# Patient Record
Sex: Male | Born: 1970 | Race: White | Hispanic: No | Marital: Single | Smoking: Former smoker
Health system: Southern US, Community
[De-identification: ages and names within clinical notes are randomized; demographics above are authoritative.]

## PROBLEM LIST (undated history)

## (undated) ENCOUNTER — Ambulatory Visit: Discharge status: Absent without leave | Payer: BLUE CROSS/BLUE SHIELD

## (undated) DIAGNOSIS — J020 Streptococcal pharyngitis: Secondary | ICD-10-CM

## (undated) HISTORY — PX: BACK SURGERY: SHX140

## (undated) HISTORY — PX: TONSILLECTOMY: SUR1361

---

## 2000-12-29 ENCOUNTER — Encounter: Payer: Self-pay | Admitting: Emergency Medicine

## 2000-12-29 ENCOUNTER — Emergency Department (HOSPITAL_COMMUNITY): Admission: EM | Admit: 2000-12-29 | Discharge: 2000-12-30 | Payer: Self-pay | Admitting: Emergency Medicine

## 2000-12-30 ENCOUNTER — Encounter: Payer: Self-pay | Admitting: Emergency Medicine

## 2001-01-05 ENCOUNTER — Emergency Department (HOSPITAL_COMMUNITY): Admission: EM | Admit: 2001-01-05 | Discharge: 2001-01-05 | Payer: Self-pay

## 2006-08-19 ENCOUNTER — Emergency Department: Payer: Self-pay | Admitting: Emergency Medicine

## 2007-12-04 ENCOUNTER — Ambulatory Visit: Payer: Self-pay | Admitting: Anesthesiology

## 2007-12-15 ENCOUNTER — Ambulatory Visit: Payer: Self-pay | Admitting: Anesthesiology

## 2008-01-13 ENCOUNTER — Ambulatory Visit: Payer: Self-pay | Admitting: Anesthesiology

## 2008-03-04 ENCOUNTER — Ambulatory Visit: Payer: Self-pay | Admitting: Anesthesiology

## 2008-04-13 ENCOUNTER — Ambulatory Visit: Payer: Self-pay | Admitting: Anesthesiology

## 2008-05-18 ENCOUNTER — Ambulatory Visit (HOSPITAL_COMMUNITY): Admission: RE | Admit: 2008-05-18 | Discharge: 2008-05-19 | Payer: Self-pay | Admitting: Neurosurgery

## 2008-06-22 ENCOUNTER — Encounter: Admission: RE | Admit: 2008-06-22 | Discharge: 2008-06-22 | Payer: Self-pay | Admitting: Neurosurgery

## 2008-06-22 ENCOUNTER — Emergency Department (HOSPITAL_COMMUNITY): Admission: EM | Admit: 2008-06-22 | Discharge: 2008-06-23 | Payer: Self-pay | Admitting: Emergency Medicine

## 2008-07-07 ENCOUNTER — Encounter: Admission: RE | Admit: 2008-07-07 | Discharge: 2008-07-07 | Payer: Self-pay | Admitting: Neurosurgery

## 2008-07-26 ENCOUNTER — Encounter: Admission: RE | Admit: 2008-07-26 | Discharge: 2008-07-26 | Payer: Self-pay | Admitting: Neurosurgery

## 2009-02-15 ENCOUNTER — Encounter: Admission: RE | Admit: 2009-02-15 | Discharge: 2009-02-15 | Payer: Self-pay | Admitting: Neurosurgery

## 2010-07-12 LAB — DIFFERENTIAL
Basophils Absolute: 0 10*3/uL (ref 0.0–0.1)
Basophils Relative: 0 % (ref 0–1)
Eosinophils Absolute: 0 10*3/uL (ref 0.0–0.7)
Eosinophils Relative: 0 % (ref 0–5)
Lymphocytes Relative: 14 % (ref 12–46)
Lymphs Abs: 0.6 10*3/uL — ABNORMAL LOW (ref 0.7–4.0)
Monocytes Absolute: 0.1 10*3/uL (ref 0.1–1.0)
Monocytes Relative: 1 % — ABNORMAL LOW (ref 3–12)
Neutro Abs: 4 10*3/uL (ref 1.7–7.7)
Neutrophils Relative %: 85 % — ABNORMAL HIGH (ref 43–77)

## 2010-07-12 LAB — CBC
HCT: 41.9 % (ref 39.0–52.0)
Hemoglobin: 14.8 g/dL (ref 13.0–17.0)
MCHC: 35.4 g/dL (ref 30.0–36.0)
MCV: 83.9 fL (ref 78.0–100.0)
Platelets: 312 10*3/uL (ref 150–400)
RBC: 4.99 MIL/uL (ref 4.22–5.81)
RDW: 13.2 % (ref 11.5–15.5)
WBC: 4.7 10*3/uL (ref 4.0–10.5)

## 2010-07-12 LAB — BASIC METABOLIC PANEL
BUN: 11 mg/dL (ref 6–23)
CO2: 21 mEq/L (ref 19–32)
Calcium: 9.8 mg/dL (ref 8.4–10.5)
Chloride: 108 mEq/L (ref 96–112)
Creatinine, Ser: 1.18 mg/dL (ref 0.4–1.5)
GFR calc Af Amer: >60 mL/min (ref >60)
GFR calc non Af Amer: >60 mL/min (ref >60)
Glucose, Bld: 381 mg/dL — ABNORMAL HIGH (ref 70–99)
Potassium: 4 mEq/L (ref 3.5–5.1)
Sodium: 138 mEq/L (ref 135–145)

## 2010-07-12 LAB — TSH: TSH: 0.269 u[IU]/mL — ABNORMAL LOW (ref 0.350–4.500)

## 2010-07-17 LAB — CBC
HCT: 42.8 % (ref 39.0–52.0)
Hemoglobin: 14.9 g/dL (ref 13.0–17.0)
MCHC: 34.9 g/dL (ref 30.0–36.0)
MCV: 85 fL (ref 78.0–100.0)
Platelets: 227 10*3/uL (ref 150–400)
RBC: 5.03 MIL/uL (ref 4.22–5.81)
RDW: 13.3 % (ref 11.5–15.5)
WBC: 3.6 10*3/uL — ABNORMAL LOW (ref 4.0–10.5)

## 2010-08-14 NOTE — Op Note (Signed)
NAME:  Travis Dorsey, Travis Dorsey              ACCOUNT NO.:  [id number]   MEDICAL RECORD NO.:  [id number]          PATIENT TYPE:  OIB   LOCATION:  3536                         FACILITY:  [facility]   PHYSICIAN:  Hilda Lias, M.D.   DATE OF BIRTH:  09/23/1970   DATE OF PROCEDURE:  05/18/2008  DATE OF DISCHARGE:                               OPERATIVE REPORT   PREOPERATIVE DIAGNOSIS:  Right L5-S1 herniated disk.   POSTOPERATIVE DIAGNOSES:  Right L5-S1 herniated disk.   PROCEDURES:  Right L5-S1 diskectomy, removal of fragment, foraminotomy;  microscope.   SURGEON:  Hilda Lias, MD   ASSISTANT:  Cristi Loron, MD   CLINICAL HISTORY:  The patient was seen because of back pain worse than  right leg.  He had failed with conservative treatment.  X-rays showed a  large herniated disk at level 5-1.  Surgery was advised.   PROCEDURE:  The patient was taken to the OR, and after intubation, he  was positioned in a prone manner.  The skin was cleaned with DuraPrep.  X-rays showed that we were at the level 5-1.  Then after we draped the  area, midline incision was done for L5-S1 and muscle retracted  laterally.  We brought microscope into the area and with a drill, we  drilled the lower lamina of L5 at the level of S1.  A thick yellow  ligament was also excised.  Immediately what was found was a large  herniated disk and the S1 nerve root was attached to it.  Lysis was  accomplished, and we retracted the S1 nerve root.  Then, incision was  made and a large fragment was removed.  The ligament was opened, and we  went to the disk space where midline and lateral to his right,  diskectomy was accomplished.  At the end, we had a good decompression of  the S1 as well as the thecal sac.  Investigation with the nerve hook  showed no more evidence of fragment.  The patient had quite a bit of  spondylosis.  Then the area was irrigated.  Valsalva maneuver was  negative.  Fentanyl and Depo-Medrol were left in  the epidural space, and  the wound was closed with Vicryl and Steri-Strips.           ______________________________  Hilda Lias, M.D.     EB/MEDQ  D:  05/18/2008  T:  05/19/2008  Job:  [id number]

## 2010-08-14 NOTE — H&P (Signed)
NAME:  ARTEZ, Travis Dorsey              ACCOUNT NO.:  [id number]   MEDICAL RECORD NO.:  [id number]          PATIENT TYPE:  INP   LOCATION:  1826                         FACILITY:  [facility]   PHYSICIAN:  Carlena Hurl, MDDATE OF BIRTH:  Apr 09, 1970   DATE OF ADMISSION:  06/22/2008  DATE OF DISCHARGE:  06/23/2008                              HISTORY & PHYSICAL   CHIEF COMPLAINT:  Nonspecific symptoms of blurred vision, dizziness, and  one episode of syncope of one day duration.   HISTORY OF PRESENT ILLNESS:  This is a 40 year old Caucasian gentleman  with past medical history significant for chronic back pain with status  post L5-S1 diskectomy done in February 2010, coming in with a 1-day  episode of blurred vision, dizziness, and syncopal episode.  The history  is obtained from the patient.  According to him, this patient had hurt  his back in July 2009 and during which time, he had a herniated disk of  L5-S1 and he underwent L5-S1 diskectomy with removal of fragment and  foraminotomy from Dr. Jeral Fruit on May 18, 2008.  Since then he was  started on pain medications as well as Valium and Neurontin.  He says  that whenever he takes his Valium or Neurontin, he gets vague  nonspecific symptoms of blurred vision, dizziness, and also he says that  he cannot think straight and because of that he went to see his  physician and was told to stop taking these medication immediately.  After stopping Neurontin and Valium, the patient's symptoms disappeared.  But again in the past 2 days, the patient started having significant  pain of his back as well as the right leg and around which time, the  patient had an MRI done and which showed significant scar and his  physician started him back on his Valium and Neurontin.  For this, he  again started having this blurred vision, dizziness, and also while at  home, when the patient tried to move from couch to his bedroom, he had 1  episode of  syncope.  He states that he does not remember exactly what  happened after few minutes.  He saw his friends surrounding him and also  his wife and they told him that he was having some trouble breathing and  had loss of consciousness.  Again, this morning, the patient underwent  an epidural injection for his back pain and the patient and the  patient's family does not remember exactly what dose was given to  epidural.  He states that even after epidural, the patient continued to  have the same problems of blurred vision and dizziness and also he had  significant numbness of bilateral arms and bilateral hands.  The patient  denies having any headache, chest pain.  The patient denies having any  abdominal pain, diarrhea, or constipation.   PAST MEDICAL HISTORY:  Significant for chronic back pain and nothing  significant.   PAST SURGICAL HISTORY:  Significant for L5-S1 diskectomy on May 18, 2008.   ALLERGIES:  No known drug allergies.   MEDICATIONS:  This patient is on at home  are Valium, Neurontin.   FAMILY HISTORY:  Nothing significant.   REVIEW OF SYSTEMS:  Pretty much the same as history of present illness.   PHYSICAL EXAMINATION:  GENERAL:  This is a 40 year old gentleman who is  lying comfortably on the bed without any significant chest pain or  shortness of breath.  VITAL SIGNS:  His blood pressure when he came into the ER 146/94, later  dropped down to 112/75, pulse rate of 130, which later dropped to 101,  respiratory rate of 22, saturating 97% on room air.  HEENT:  Head is atraumatic and normocephalic.  Pupils, PERRLA.  Tympanic  membrane intact.  No discharge from the eyes or ears.  LUNGS:  Clear to auscultation bilaterally.  CVS:  S1, S2 heard with regular rate and rhythm.  ABDOMEN:  Soft.  Bowel sounds present.  Nontender.  Nondistended.  EXTREMITIES:  No pedal edema noted.  Pulses are palpable bilaterally.  CNS:  Awake, alert, oriented x3.  No focal neurological  deficits noted.   LABORATORY DATA:  WBC 4.7, hemoglobin 14.8, platelets of 312.  Sodium  138, potassium 4.0, chloride 108, bicarb 28, glucose 381, BUN of 11,  creatinine of 1.18.  The patient had CT chest done, which showed  multiple, small, shaggy pulmonary nodules in the posterior aspect of the  right upper lobe consistent with an atypical infection.   ASSESSMENT AND PLAN:  1. Sinus tachycardia.  At this time, clearly no source of infection is      found other than the CT chest, which showed right upper lobe small      pulmonary nodules consistent with atypical infection.  The patient      denies having any temperature at home and he was having only cough,      but other than that he was not bringing up any sputum and no      significant chills or fever at home and nobody is sick at home.      The patient had walking pneumonia that was about 8 years ago, when      he was in [employer] but since then he never had any problems with      his lung.  So at this time, we are going to start this patient on      Zithromax 500 mg p.o. 1 time a day to cover this atypical infection      and hopefully once this infection resolves, the sinus tachycardia      should resolve.  So, we will continue to monitor his heart rate,      and if the patient's heart rate continues to be high even after his      infection is cured, we will evaluate his sinus tachycardia further.  2. Nonspecific symptoms/polypharmacy.  The symptoms that the patient      is having so far is because of his Valium and Neurontin.  So at      this time, we are going to hold all the medications and give him      only Tylenol as needed for pain as well as Dilaudid as needed for      severe pain.  3. History of chronic back pain with status post surgery.  The patient      states that his pain is much tolerable, so we will give him only      Tylenol at this time.  For DVT prophylaxis, the patient will be  placed on Lovenox 40 mg subcu  one time a day.      Carlena Hurl, MD  Electronically Signed     JD/MEDQ  D:  06/22/2008  T:  06/23/2008  Job:  [id number]

## 2015-02-09 ENCOUNTER — Ambulatory Visit
Admission: EM | Admit: 2015-02-09 | Discharge: 2015-02-09 | Disposition: A | Discharge status: Home / Self Care | Payer: BLUE CROSS/BLUE SHIELD | Attending: Family Medicine | Admitting: Family Medicine

## 2015-02-09 DIAGNOSIS — J069 Acute upper respiratory infection, unspecified: Secondary | ICD-10-CM | POA: Diagnosis not present

## 2015-02-09 DIAGNOSIS — R6889 Other general symptoms and signs: Secondary | ICD-10-CM

## 2015-02-09 DIAGNOSIS — J0100 Acute maxillary sinusitis, unspecified: Secondary | ICD-10-CM

## 2015-02-09 HISTORY — DX: Streptococcal pharyngitis: J02.0

## 2015-02-09 LAB — RAPID INFLUENZA A&B ANTIGENS
Influenza A (ARMC): NEGATIVE
Influenza B (ARMC): NEGATIVE

## 2015-02-09 LAB — RAPID STREP SCREEN (MED CTR MEBANE ONLY): Streptococcus, Group A Screen (Direct): NEGATIVE

## 2015-02-09 MED ORDER — HYDROCOD POLST-CPM POLST ER 10-8 MG/5ML PO SUER: 5.0000 mL | Freq: Two times a day (BID) | ORAL | Status: DC | PRN

## 2015-02-09 MED ORDER — FEXOFENADINE-PSEUDOEPHED ER 180-240 MG PO TB24: 1.0000 | ORAL_TABLET | Freq: Every day | ORAL | Status: DC

## 2015-02-09 MED ORDER — ALBUTEROL SULFATE HFA 108 (90 BASE) MCG/ACT IN AERS: 2.0000 | INHALATION_SPRAY | RESPIRATORY_TRACT | Status: DC | PRN

## 2015-02-09 MED ORDER — AZITHROMYCIN 500 MG PO TABS: 500.0000 mg | ORAL_TABLET | Freq: Every day | ORAL | Status: DC

## 2015-02-09 MED ORDER — OSELTAMIVIR PHOSPHATE 75 MG PO CAPS: 75.0000 mg | ORAL_CAPSULE | Freq: Two times a day (BID) | ORAL | Status: DC

## 2015-02-09 NOTE — ED Notes (Signed)
Saturday started with head congestion and sore throat. Felt a lot better Sunday, then Monday worsening head congestion and Tuesday productive cough. Denies fever

## 2015-02-09 NOTE — Discharge Instructions (Signed)
Sinusitis, Adult Sinusitis is redness, soreness, and puffiness (inflammation) of the air pockets in the bones of your face (sinuses). The redness, soreness, and puffiness can cause air and mucus to get trapped in your sinuses. This can allow germs to grow and cause an infection.  HOME CARE   Drink enough fluids to keep your pee (urine) clear or pale yellow.  Use a humidifier in your home.  Run a hot shower to create steam in the bathroom. Sit in the bathroom with the door closed. Breathe in the steam 3-4 times a day.  Put a warm, moist washcloth on your face 3-4 times a day, or as told by your doctor.  Use salt water sprays (saline sprays) to wet the thick fluid in your nose. This can help the sinuses drain.  Only take medicine as told by your doctor. GET HELP RIGHT AWAY IF:   Your pain gets worse.  You have very bad headaches.  You are sick to your stomach (nauseous).  You throw up (vomit).  You are very sleepy (drowsy) all the time.  Your face is puffy (swollen).  Your vision changes.  You have a stiff neck.  You have trouble breathing. MAKE SURE YOU:   Understand these instructions.  Will watch your condition.  Will get help right away if you are not doing well or get worse.   This information is not intended to replace advice given to you by your health care provider. Make sure you discuss any questions you have with your health care provider.   Document Released: 09/04/2007 Document Revised: 04/08/2014 Document Reviewed: 10/22/2011 Elsevier Interactive Patient Education 2016 Elsevier Inc.  Upper Respiratory Infection, Adult Most upper respiratory infections (URIs) are caused by a virus. A URI affects the nose, throat, and upper air passages. The most common type of URI is often called "the common cold." HOME CARE   Take medicines only as told by your doctor.  Gargle warm saltwater or take cough drops to comfort your throat as told by your doctor.  Use a  warm mist humidifier or inhale steam from a shower to increase air moisture. This may make it easier to breathe.  Drink enough fluid to keep your pee (urine) clear or pale yellow.  Eat soups and other clear broths.  Have a healthy diet.  Rest as needed.  Go back to work when your fever is gone or your doctor says it is okay.  You may need to stay home longer to avoid giving your URI to others.  You can also wear a face mask and wash your hands often to prevent spread of the virus.  Use your inhaler more if you have asthma.  Do not use any tobacco products, including cigarettes, chewing tobacco, or electronic cigarettes. If you need help quitting, ask your doctor. GET HELP IF:  You are getting worse, not better.  Your symptoms are not helped by medicine.  You have chills.  You are getting more short of breath.  You have brown or red mucus.  You have yellow or brown discharge from your nose.  You have pain in your face, especially when you bend forward.  You have a fever.  You have puffy (swollen) neck glands.  You have pain while swallowing.  You have white areas in the back of your throat. GET HELP RIGHT AWAY IF:   You have very bad or constant:  Headache.  Ear pain.  Pain in your forehead, behind your eyes, and over  your cheekbones (sinus pain).  Chest pain.  You have long-lasting (chronic) lung disease and any of the following:  Wheezing.  Long-lasting cough.  Coughing up blood.  A change in your usual mucus.  You have a stiff neck.  You have changes in your:  Vision.  Hearing.  Thinking.  Mood. MAKE SURE YOU:   Understand these instructions.  Will watch your condition.  Will get help right away if you are not doing well or get worse.   This information is not intended to replace advice given to you by your health care provider. Make sure you discuss any questions you have with your health care provider.   Document Released:  09/04/2007 Document Revised: 08/02/2014 Document Reviewed: 06/23/2013 Elsevier Interactive Patient Education 2016 Elsevier Inc.  Viral Infections A virus is a type of germ. Viruses can cause:  Minor sore throats.  Aches and pains.  Headaches.  Runny nose.  Rashes.  Watery eyes.  Tiredness.  Coughs.  Loss of appetite.  Feeling sick to your stomach (nausea).  Throwing up (vomiting).  Watery poop (diarrhea). HOME CARE   Only take medicines as told by your doctor.  Drink enough water and fluids to keep your pee (urine) clear or pale yellow. Sports drinks are a good choice.  Get plenty of rest and eat healthy. Soups and broths with crackers or rice are fine. GET HELP RIGHT AWAY IF:   You have a very bad headache.  You have shortness of breath.  You have chest pain or neck pain.  You have an unusual rash.  You cannot stop throwing up.  You have watery poop that does not stop.  You cannot keep fluids down.  You or your child has a temperature by mouth above 102 F (38.9 C), not controlled by medicine.  Your baby is older than 3 months with a rectal temperature of 102 F (38.9 C) or higher.  Your baby is 643 months old or younger with a rectal temperature of 100.4 F (38 C) or higher. MAKE SURE YOU:   Understand these instructions.  Will watch this condition.  Will get help right away if you are not doing well or get worse.   This information is not intended to replace advice given to you by your health care provider. Make sure you discuss any questions you have with your health care provider.   Document Released: 02/29/2008 Document Revised: 06/10/2011 Document Reviewed: 08/24/2014 Elsevier Interactive Patient Education 2016 Elsevier Inc.  Cough, Adult A cough helps to clear your throat and lungs. A cough may last only 2-3 weeks (acute), or it may last longer than 8 weeks (chronic). Many different things can cause a cough. A cough may be a sign of an  illness or another medical condition. HOME CARE  Pay attention to any changes in your cough.  Take medicines only as told by your doctor.  If you were prescribed an antibiotic medicine, take it as told by your doctor. Do not stop taking it even if you start to feel better.  Talk with your doctor before you try using a cough medicine.  Drink enough fluid to keep your pee (urine) clear or pale yellow.  If the air is dry, use a cold steam vaporizer or humidifier in your home.  Stay away from things that make you cough at work or at home.  If your cough is worse at night, try using extra pillows to raise your head up higher while you sleep.  Do not smoke, and try not to be around smoke. If you need help quitting, ask your doctor.  Do not have caffeine.  Do not drink alcohol.  Rest as needed. GET HELP IF:  You have new problems (symptoms).  You cough up yellow fluid (pus).  Your cough does not get better after 2-3 weeks, or your cough gets worse.  Medicine does not help your cough and you are not sleeping well.  You have pain that gets worse or pain that is not helped with medicine.  You have a fever.  You are losing weight and you do not know why.  You have night sweats. GET HELP RIGHT AWAY IF:  You cough up blood.  You have trouble breathing.  Your heartbeat is very fast.   This information is not intended to replace advice given to you by your health care provider. Make sure you discuss any questions you have with your health care provider.   Document Released: 11/29/2010 Document Revised: 12/07/2014 Document Reviewed: 05/25/2014 Elsevier Interactive Patient Education [employer]

## 2015-02-09 NOTE — ED Provider Notes (Signed)
CSN: [id number]     Arrival date & time 02/09/15  [id number] History   First MD Initiated Contact with Patient 02/09/15 [phone]      Nurses notes were reviewed. Chief Complaint  Patient presents with  . URI    Patient states he started developing his usual yearly cold Saturday got better on Sunday but then on Monday start becoming congested again and cold-like symptoms. By Tuesday he started having myalgia chills and aching all over and get worse yesterday and is continued today. States that last night especially difficult from sleep and rest had trouble with his esophageal reflux and blood coming from the right nostril to his nose pain in both ears and a sore throat as well.  (Consider location/radiation/quality/duration/timing/severity/associated sxs/prior Treatment) Patient is a 44 y.o. male presenting with URI. The history is provided by the patient and the spouse. No language interpreter was used.  URI Presenting symptoms: congestion, ear pain, facial pain, fatigue, rhinorrhea and sore throat   Congestion:    Location:  Nasal and chest Ear pain:    Location:  Bilateral   Severity:  Moderate   Chronicity:  New Fatigue:    Severity:  Moderate   Duration:  2 days   Timing:  Constant   Progression:  Worsening Severity:  Mild Associated symptoms: headaches, myalgias, sinus pain, swollen glands and wheezing   Risk factors: not elderly, no chronic cardiac disease, no chronic kidney disease, no chronic respiratory disease, no diabetes mellitus, no immunosuppression, no recent illness, no recent travel and no sick contacts     Past Medical History  Diagnosis Date  . Strep throat    Past Surgical History  Procedure Laterality Date  . Back surgery    . Tonsillectomy     History reviewed. No pertinent family history. Social History  Substance Use Topics  . Smoking status: Former Games developermoker  . Smokeless tobacco: None  . Alcohol Use: No    Review of Systems  Constitutional: Positive for  fatigue.  HENT: Positive for congestion, ear pain, rhinorrhea and sore throat.   Respiratory: Positive for wheezing.   Musculoskeletal: Positive for myalgias.  Neurological: Positive for headaches.  All other systems reviewed and are negative.   Allergies  Penicillins  Home Medications   Prior to Admission medications   Medication Sig Start Date End Date Taking? Authorizing Provider  albuterol (PROVENTIL HFA;VENTOLIN HFA) 108 (90 BASE) MCG/ACT inhaler Inhale 2 puffs into the lungs every 4 (four) hours as needed for wheezing or shortness of breath. 02/09/15   Hassan RowanEugene Monigue Spraggins, MD  azithromycin (ZITHROMAX) 500 MG tablet Take 1 tablet (500 mg total) by mouth daily. Take first 2 tablets together, then 1 every day until finished. 02/09/15   Hassan RowanEugene Sloan Galentine, MD  chlorpheniramine-HYDROcodone [facility](TUSSIONEX PENNKINETIC ER) 10-8 MG/5ML SUER Take 5 mLs by mouth every 12 (twelve) hours as needed for cough. 02/09/15   Hassan RowanEugene Kalkidan Caudell, MD  fexofenadine-pseudoephedrine (ALLEGRA-D ALLERGY & CONGESTION) 180-240 MG 24 hr tablet Take 1 tablet by mouth daily. 02/09/15   Hassan RowanEugene Laityn Bensen, MD  oseltamivir (TAMIFLU) 75 MG capsule Take 1 capsule (75 mg total) by mouth 2 (two) times daily. 02/09/15   Hassan RowanEugene Trammell Bowden, MD   Meds Ordered and Administered this Visit  Medications - No data to display  BP 134/96 mmHg  Pulse 102  Temp(Src) 97 F (36.1 C) (Tympanic)  Resp 18  Ht 6\' 1"  (1.854 m)  Wt 234 lb (106.142 kg)  BMI 30.88 kg/m2  SpO2 98% Orthostatic VS for the past  24 hrs:  BP- Lying Pulse- Lying BP- Sitting Pulse- Sitting BP- Standing at 0 minutes Pulse- Standing at 0 minutes  02/09/15 0804 132/86 mmHg 96 (!) 137/108 mmHg 96 (!) 128/100 mmHg 94    Physical Exam  Constitutional: He appears well-developed and well-nourished.  HENT:  Head: Normocephalic and atraumatic.  Right Ear: Hearing normal. Tympanic membrane is scarred.  Left Ear: Hearing normal. Tympanic membrane is scarred.  Nose: Mucosal edema and rhinorrhea  present.  Mouth/Throat: He does not have dentures. Normal dentition. No dental caries. Posterior oropharyngeal erythema present.  Eyes: Conjunctivae are normal. Pupils are equal, round, and reactive to light.  Neck: Normal range of motion. Neck supple. No thyromegaly present.  Cardiovascular: Normal rate.   Pulmonary/Chest: Effort normal and breath sounds normal. No respiratory distress.  Musculoskeletal: Normal range of motion.  Neurological: He is alert.  Skin: Skin is warm and dry. No erythema.  Psychiatric: He has a normal mood and affect. His behavior is normal.  Vitals reviewed.   ED Course  Procedures (including critical care time)  Labs Review Labs Reviewed  RAPID INFLUENZA A&B ANTIGENS ([facility] ONLY)  RAPID STREP SCREEN (NOT AT [facility])  CULTURE, GROUP A STREP ([facility] ONLY)    Imaging Review No results found.   Visual Acuity Review  Right Eye Distance:   Left Eye Distance:   Bilateral Distance:    Right Eye Near:   Left Eye Near:    Bilateral Near:       Results for orders placed or performed during the hospital encounter of 02/09/15  Rapid Influenza A&B Antigens ([facility] only)  Result Value Ref Range   Influenza A ([facility]) NEGATIVE    Influenza B ([facility]) NEGATIVE   Rapid strep screen  Result Value Ref Range   Streptococcus, Group A Screen (Direct) NEGATIVE NEGATIVE      MDM   1. URI, acute   2. Acute maxillary sinusitis, recurrence not specified   3. Flu-like symptoms     Patient will have strep test and flu test done. If flu test is not positive question whether placement medication keeping with the symptoms started Monday or Tuesday.    Hassan Rowan, MD 02/09/15 [phone]

## 2015-02-11 LAB — CULTURE, GROUP A STREP (THRC)

## 2015-02-13 NOTE — ED Notes (Signed)
Final report of strep testing negative  

## 2015-02-16 ENCOUNTER — Encounter: Payer: Self-pay | Admitting: Family Medicine

## 2015-02-16 ENCOUNTER — Ambulatory Visit (INDEPENDENT_AMBULATORY_CARE_PROVIDER_SITE_OTHER): Payer: BLUE CROSS/BLUE SHIELD | Admitting: Family Medicine

## 2015-02-16 VITALS — BP 134/93 | HR 90 | Temp 97.3°F | Ht 72.0 in | Wt 232.0 lb

## 2015-02-16 DIAGNOSIS — K219 Gastro-esophageal reflux disease without esophagitis: Secondary | ICD-10-CM | POA: Diagnosis not present

## 2015-02-16 DIAGNOSIS — R030 Elevated blood-pressure reading, without diagnosis of hypertension: Secondary | ICD-10-CM | POA: Diagnosis not present

## 2015-02-16 DIAGNOSIS — IMO0001 Reserved for inherently not codable concepts without codable children: Secondary | ICD-10-CM

## 2015-02-16 DIAGNOSIS — R195 Other fecal abnormalities: Secondary | ICD-10-CM | POA: Diagnosis not present

## 2015-02-16 DIAGNOSIS — J330 Polyp of nasal cavity: Secondary | ICD-10-CM

## 2015-02-16 DIAGNOSIS — R109 Unspecified abdominal pain: Secondary | ICD-10-CM

## 2015-02-16 MED ORDER — OMEPRAZOLE 20 MG PO CPDR: 20.0000 mg | DELAYED_RELEASE_CAPSULE | Freq: Two times a day (BID) | ORAL | Status: DC

## 2015-02-16 MED ORDER — MOMETASONE FUROATE 50 MCG/ACT NA SUSP: 2.0000 | Freq: Every day | NASAL | Status: DC
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
## 2015-02-16 NOTE — Patient Instructions (Addendum)
We'll refer you to a gastroenterologist Start the new medicine to help reduce acid Elevated the head of your bed slightly I do strongly encourage you to quit all smokless tobacco Try to follow the DASH guidelines to help control your blood pressure Stop the fexofenadrine with the decongestants Try to use PLAIN allergy medicine without the decongestant Avoid: phenylephrine, phenylpropanolamine, and pseudoephredine If you need something for aches or pains, try to use Tylenol (acetaminphen) instead of non-steroidals (which include Aleve, ibuprofen, Advil, Motrin, and naproxen); non-steroidals can cause long-term kidney damage Avoid lawn-mowing Start the new nasal spray Limit physical activity until next appointment  Gastroesophageal Reflux Disease, Adult Normally, food travels down the esophagus and stays in the stomach to be digested. However, when a person has gastroesophageal reflux disease (GERD), food and stomach acid move back up into the esophagus. When this happens, the esophagus becomes sore and inflamed. Over time, GERD can create small holes (ulcers) in the lining of the esophagus.  CAUSES This condition is caused by a problem with the muscle between the esophagus and the stomach (lower esophageal sphincter, or LES). Normally, the LES muscle closes after food passes through the esophagus to the stomach. When the LES is weakened or abnormal, it does not close properly, and that allows food and stomach acid to go back up into the esophagus. The LES can be weakened by certain dietary substances, medicines, and medical conditions, including:  Tobacco use.  Pregnancy.  Having a hiatal hernia.  Heavy alcohol use.  Certain foods and beverages, such as coffee, chocolate, onions, and peppermint. RISK FACTORS This condition is more likely to develop in:  People who have an increased body weight.  People who have connective tissue disorders.  People who use NSAID  medicines. SYMPTOMS Symptoms of this condition include:  Heartburn.  Difficult or painful swallowing.  The feeling of having a lump in the throat.  Abitter taste in the mouth.  Bad breath.  Having a large amount of saliva.  Having an upset or bloated stomach.  Belching.  Chest pain.  Shortness of breath or wheezing.  Ongoing (chronic) cough or a night-time cough.  Wearing away of tooth enamel.  Weight loss. Different conditions can cause chest pain. Make sure to see your health care provider if you experience chest pain. DIAGNOSIS Your health care provider will take a medical history and perform a physical exam. To determine if you have mild or severe GERD, your health care provider may also monitor how you respond to treatment. You may also have other tests, including:  An endoscopy toexamine your stomach and esophagus with a small camera.  A test thatmeasures the acidity level in your esophagus.  A test thatmeasures how much pressure is on your esophagus.  A barium swallow or modified barium swallow to show the shape, size, and functioning of your esophagus. TREATMENT The goal of treatment is to help relieve your symptoms and to prevent complications. Treatment for this condition may vary depending on how severe your symptoms are. Your health care provider may recommend:  Changes to your diet.  Medicine.  Surgery. HOME CARE INSTRUCTIONS Diet  Follow a diet as recommended by your health care provider. This may involve avoiding foods and drinks such as:  Coffee and tea (with or without caffeine).  Drinks that containalcohol.  Energy drinks and sports drinks.  Carbonated drinks or sodas.  Chocolate and cocoa.  Peppermint and mint flavorings.  Garlic and onions.  Horseradish.  Spicy and acidic foods, including  peppers, chili powder, curry powder, vinegar, hot sauces, and barbecue sauce.  Citrus fruit juices and citrus fruits, such as oranges,  lemons, and limes.  Tomato-based foods, such as red sauce, chili, salsa, and pizza with red sauce.  Fried and fatty foods, such as donuts, french fries, potato chips, and high-fat dressings.  High-fat meats, such as hot dogs and fatty cuts of red and white meats, such as rib eye steak, sausage, ham, and bacon.  High-fat dairy items, such as whole milk, butter, and cream cheese.  Eat small, frequent meals instead of large meals.  Avoid drinking large amounts of liquid with your meals.  Avoid eating meals during the 2-3 hours before bedtime.  Avoid lying down right after you eat.  Do not exercise right after you eat. General Instructions  Pay attention to any changes in your symptoms.  Take over-the-counter and prescription medicines only as told by your health care provider. Do not take aspirin, ibuprofen, or other NSAIDs unless your health care provider told you to do so.  Do not use any tobacco products, including cigarettes, chewing tobacco, and e-cigarettes. If you need help quitting, ask your health care provider.  Wear loose-fitting clothing. Do not wear anything tight around your waist that causes pressure on your abdomen.  Raise (elevate) the head of your bed 6 inches (15cm).  Try to reduce your stress, such as with yoga or meditation. If you need help reducing stress, ask your health care provider.  If you are overweight, reduce your weight to an amount that is healthy for you. Ask your health care provider for guidance about a safe weight loss goal.  Keep all follow-up visits as told by your health care provider. This is important. SEEK MEDICAL CARE IF:  You have new symptoms.  You have unexplained weight loss.  You have difficulty swallowing, or it hurts to swallow.  You have wheezing or a persistent cough.  Your symptoms do not improve with treatment.  You have a hoarse voice. SEEK IMMEDIATE MEDICAL CARE IF:  You have pain in your arms, neck, jaw,  teeth, or back.  You feel sweaty, dizzy, or light-headed.  You have chest pain or shortness of breath.  You vomit and your vomit looks like blood or coffee grounds.  You faint.  Your stool is bloody or black.  You cannot swallow, drink, or eat.   This information is not intended to replace advice given to you by your health care provider. Make sure you discuss any questions you have with your health care provider.   Document Released: 12/26/2004 Document Revised: 12/07/2014 Document Reviewed: 07/13/2014 Elsevier Interactive Patient Education [employer]2016 [employer] Inc[employer]

## 2015-02-17 LAB — CBC WITH DIFFERENTIAL/PLATELET
Basophils Absolute: 0 10*3/uL (ref 0.0–0.2)
Basos: 1 %
EOS (ABSOLUTE): 0.2 10*3/uL (ref 0.0–0.4)
Eos: 3 %
Hematocrit: 42.9 % (ref 37.5–51.0)
Hemoglobin: 14.6 g/dL (ref 12.6–17.7)
Immature Grans (Abs): 0 10*3/uL (ref 0.0–0.1)
Immature Granulocytes: 0 %
Lymphocytes Absolute: 1.8 10*3/uL (ref 0.7–3.1)
Lymphs: 32 %
MCH: 27.7 pg (ref 26.6–33.0)
MCHC: 34 g/dL (ref 31.5–35.7)
MCV: 81 fL (ref 79–97)
Monocytes Absolute: 0.6 10*3/uL (ref 0.1–0.9)
Monocytes: 11 %
Neutrophils Absolute: 3 10*3/uL (ref 1.4–7.0)
Neutrophils: 53 %
Platelets: 295 10*3/uL (ref 150–379)
RBC: 5.28 x10E6/uL (ref 4.14–5.80)
RDW: 13.8 % (ref 12.3–15.4)
WBC: 5.6 10*3/uL (ref 3.4–10.8)

## 2015-02-17 LAB — COMPREHENSIVE METABOLIC PANEL
ALT: 36 IU/L (ref 0–44)
AST: 26 IU/L (ref 0–40)
Albumin/Globulin Ratio: 1.9 (ref 1.1–2.5)
Albumin: 4.7 g/dL (ref 3.5–5.5)
Alkaline Phosphatase: 88 IU/L (ref 39–117)
BUN/Creatinine Ratio: 9 (ref 9–20)
BUN: 12 mg/dL (ref 6–24)
Bilirubin Total: 0.2 mg/dL (ref 0.0–1.2)
CO2: 24 mmol/L (ref 18–29)
Calcium: 9.8 mg/dL (ref 8.7–10.2)
Chloride: 99 mmol/L (ref 97–106)
Creatinine, Ser: 1.32 mg/dL — ABNORMAL HIGH (ref 0.76–1.27)
GFR calc Af Amer: 76 mL/min/{1.73_m2} (ref >59)
GFR calc non Af Amer: 66 mL/min/{1.73_m2} (ref >59)
Globulin, Total: 2.5 g/dL (ref 1.5–4.5)
Glucose: 124 mg/dL — ABNORMAL HIGH (ref 65–99)
Potassium: 4.4 mmol/L (ref 3.5–5.2)
Sodium: 141 mmol/L (ref 136–144)
Total Protein: 7.2 g/dL (ref 6.0–8.5)

## 2015-02-17 LAB — HELICOBACTER PYLORI  ANTIBODY, IGM: H pylori, IgM Abs: <9 units (ref 0.0–8.9)

## 2015-02-21 ENCOUNTER — Ambulatory Visit: Payer: BLUE CROSS/BLUE SHIELD | Admitting: Family Medicine

## 2015-02-21 DIAGNOSIS — R109 Unspecified abdominal pain: Secondary | ICD-10-CM | POA: Insufficient documentation

## 2015-02-21 DIAGNOSIS — J330 Polyp of nasal cavity: Secondary | ICD-10-CM | POA: Insufficient documentation

## 2015-02-21 DIAGNOSIS — IMO0001 Reserved for inherently not codable concepts without codable children: Secondary | ICD-10-CM | POA: Insufficient documentation

## 2015-02-21 DIAGNOSIS — K219 Gastro-esophageal reflux disease without esophagitis: Secondary | ICD-10-CM | POA: Insufficient documentation

## 2015-02-21 DIAGNOSIS — R030 Elevated blood-pressure reading, without diagnosis of hypertension: Secondary | ICD-10-CM

## 2015-02-21 NOTE — Assessment & Plan Note (Signed)
May be IBS; since symptoms have been debilitating and going on for about 37 years, referring to GI

## 2015-02-21 NOTE — Assessment & Plan Note (Addendum)
Check H pylor, CBC, CMP We'll refer you to a gastroenterologist Start the new medicine to help reduce acid (PPI) Elevated the head of your bed slightly I do strongly encourage you to quit all smokless tobacco Avoid triggers which can increase stomach acid production

## 2015-02-21 NOTE — Assessment & Plan Note (Signed)
Refer to ENT Avoid lawn-mowing Start the new nasal spray

## 2015-02-21 NOTE — Assessment & Plan Note (Signed)
Try to follow the DASH guidelines to help control your blood pressure Stop the fexofenadrine with the decongestants Try to use PLAIN allergy medicine without the decongestant Avoid: phenylephrine, phenylpropanolamine, and pseudoephredine If you need something for aches or pains, try to use Tylenol (acetaminphen) instead of non-steroidals (which include Aleve, ibuprofen, Advil, Motrin, and naproxen); non-steroidals can cause long-term kidney damage

## 2015-02-27 ENCOUNTER — Ambulatory Visit
Admission: RE | Admit: 2015-02-27 | Discharge: 2015-02-27 | Disposition: A | Discharge status: Home / Self Care | Payer: BLUE CROSS/BLUE SHIELD | Source: Ambulatory Visit | Attending: Family Medicine | Admitting: Family Medicine

## 2015-02-27 DIAGNOSIS — K449 Diaphragmatic hernia without obstruction or gangrene: Secondary | ICD-10-CM | POA: Insufficient documentation

## 2015-02-27 DIAGNOSIS — K219 Gastro-esophageal reflux disease without esophagitis: Secondary | ICD-10-CM | POA: Diagnosis not present

## 2015-03-01 ENCOUNTER — Ambulatory Visit (INDEPENDENT_AMBULATORY_CARE_PROVIDER_SITE_OTHER): Payer: BLUE CROSS/BLUE SHIELD | Admitting: Family Medicine

## 2015-03-01 ENCOUNTER — Encounter: Payer: Self-pay | Admitting: Family Medicine

## 2015-03-01 VITALS — BP 114/76 | HR 76 | Temp 97.2°F | Wt 236.0 lb

## 2015-03-01 DIAGNOSIS — E669 Obesity, unspecified: Secondary | ICD-10-CM

## 2015-03-01 DIAGNOSIS — R0602 Shortness of breath: Secondary | ICD-10-CM | POA: Diagnosis not present

## 2015-03-01 DIAGNOSIS — K449 Diaphragmatic hernia without obstruction or gangrene: Secondary | ICD-10-CM

## 2015-03-01 DIAGNOSIS — R109 Unspecified abdominal pain: Secondary | ICD-10-CM

## 2015-03-01 DIAGNOSIS — R942 Abnormal results of pulmonary function studies: Secondary | ICD-10-CM | POA: Diagnosis not present

## 2015-03-01 DIAGNOSIS — K219 Gastro-esophageal reflux disease without esophagitis: Secondary | ICD-10-CM | POA: Diagnosis not present

## 2015-03-01 DIAGNOSIS — R7989 Other specified abnormal findings of blood chemistry: Secondary | ICD-10-CM

## 2015-03-01 DIAGNOSIS — R748 Abnormal levels of other serum enzymes: Secondary | ICD-10-CM

## 2015-03-01 DIAGNOSIS — R030 Elevated blood-pressure reading, without diagnosis of hypertension: Secondary | ICD-10-CM

## 2015-03-01 DIAGNOSIS — J988 Other specified respiratory disorders: Secondary | ICD-10-CM

## 2015-03-01 DIAGNOSIS — IMO0001 Reserved for inherently not codable concepts without codable children: Secondary | ICD-10-CM

## 2015-03-01 DIAGNOSIS — R195 Other fecal abnormalities: Secondary | ICD-10-CM

## 2015-03-01 MED ORDER — FLUTICASONE PROPIONATE HFA 110 MCG/ACT IN AERO: 2.0000 | INHALATION_SPRAY | Freq: Two times a day (BID) | RESPIRATORY_TRACT | Status: DC

## 2015-03-01 NOTE — Patient Instructions (Addendum)
Use your current inhaler just when needed for wheezing or shortness of breath Use the new inhaler twice a day every day for inflammation Avoid triggers Return in 6 weeks for repeat spirometry If you need something for aches or pains, try to use Tylenol (acetaminphen) instead of non-steroidals (which include Aleve, ibuprofen, Advil, Motrin, and naproxen); non-steroidals can cause long-term kidney damage
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
## 2015-03-05 DIAGNOSIS — R942 Abnormal results of pulmonary function studies: Secondary | ICD-10-CM

## 2015-03-05 DIAGNOSIS — K449 Diaphragmatic hernia without obstruction or gangrene: Secondary | ICD-10-CM | POA: Insufficient documentation

## 2015-03-05 DIAGNOSIS — E669 Obesity, unspecified: Secondary | ICD-10-CM | POA: Insufficient documentation

## 2015-03-05 DIAGNOSIS — J988 Other specified respiratory disorders: Secondary | ICD-10-CM | POA: Insufficient documentation

## 2015-03-05 DIAGNOSIS — R0602 Shortness of breath: Secondary | ICD-10-CM | POA: Insufficient documentation

## 2015-03-05 DIAGNOSIS — R7989 Other specified abnormal findings of blood chemistry: Secondary | ICD-10-CM | POA: Insufficient documentation

## 2015-03-05 NOTE — Assessment & Plan Note (Signed)
Explained minimally elevated serum creatinine on most recent labs; expect this has a lot to do with his body size and muscle mass; however, want to preserve kidney function over next 50+ years, so limit or avoid NSAIDs; stay hydrated

## 2015-03-05 NOTE — Assessment & Plan Note (Signed)
With obstruction noted on spirometry; will have him start inhaled corticosteroid and return for recheck of symptoms and repeat spirometry; CXR if not improving; does not sound cardiac in etiology

## 2015-03-05 NOTE — Assessment & Plan Note (Addendum)
Patient has seen GI; he is awaiting approval for dicyclomine which may help; colonoscopy in early January

## 2015-03-05 NOTE — Assessment & Plan Note (Signed)
Reviewed spirometry results; will start inhaled corticosteroid today and have him return in 6 weeks for recheck of symptoms as another spirometry; avoid triggers

## 2015-03-05 NOTE — Assessment & Plan Note (Addendum)
Patient has seen GI; last CBC reviewed, normal H/H without microcytic, hypochromic indices, so doubt any chronic GI blood loss; colonoscopy and EGD in early January; continue PPI

## 2015-03-05 NOTE — Assessment & Plan Note (Addendum)
Patient has seen GI; continue PPI; no evidence of H pylori on serologic testing, but GI may do biopsies, more definitive testing during EGD

## 2015-03-05 NOTE — Assessment & Plan Note (Addendum)
Noted on swallow study Nov 2016; discussed with patient; keep f/u with GI for EGD in early January, radiologist recommends direct visualization

## 2015-03-05 NOTE — Assessment & Plan Note (Signed)
Normal blood pressure noted on today's intake vitals

## 2015-03-13 ENCOUNTER — Telehealth: Payer: Self-pay

## 2015-03-13 NOTE — Telephone Encounter (Signed)
Patient notified

## 2015-03-13 NOTE — Telephone Encounter (Signed)
I don't usually prescribe antibiotics without seeing someone since antibiotics are over-prescribed in [place] and can cause serious, sometimes fatal, health problems He was just seen by ENT; I would suggest that he call the ENT doctor and see if they would be willing to do that If not, he could call urgent care back

## 2015-03-13 NOTE — Telephone Encounter (Signed)
Patient called and left a message stating that he was seen [facility] month or so ago [facility] for [facility] infection and was given Azithromycin. He lost a couple of the doses and wasn't able to complete it. He still feels like he has a sinus infection, no fever, but sinus congestion and wants to know if you'll refill his antibiotic.

## 2015-03-15 ENCOUNTER — Ambulatory Visit: Payer: Self-pay | Admitting: Family Medicine

## 2015-04-06 HISTORY — PX: ESOPHAGOGASTRODUODENOSCOPY: SHX1529

## 2015-04-06 HISTORY — PX: COLONOSCOPY: SHX174

## 2015-04-13 ENCOUNTER — Ambulatory Visit
Admission: RE | Admit: 2015-04-13 | Discharge: 2015-04-13 | Disposition: A | Discharge status: Home / Self Care | Payer: BLUE CROSS/BLUE SHIELD | Source: Ambulatory Visit | Attending: Family Medicine | Admitting: Family Medicine

## 2015-04-13 ENCOUNTER — Encounter: Payer: Self-pay | Admitting: Family Medicine

## 2015-04-13 ENCOUNTER — Ambulatory Visit (INDEPENDENT_AMBULATORY_CARE_PROVIDER_SITE_OTHER): Payer: BLUE CROSS/BLUE SHIELD | Admitting: Family Medicine

## 2015-04-13 VITALS — BP 134/93 | HR 79 | Temp 97.2°F | Wt 231.0 lb

## 2015-04-13 DIAGNOSIS — R942 Abnormal results of pulmonary function studies: Secondary | ICD-10-CM

## 2015-04-13 DIAGNOSIS — R7989 Other specified abnormal findings of blood chemistry: Secondary | ICD-10-CM

## 2015-04-13 DIAGNOSIS — J988 Other specified respiratory disorders: Secondary | ICD-10-CM

## 2015-04-13 DIAGNOSIS — R030 Elevated blood-pressure reading, without diagnosis of hypertension: Secondary | ICD-10-CM | POA: Diagnosis not present

## 2015-04-13 DIAGNOSIS — Z23 Encounter for immunization: Secondary | ICD-10-CM

## 2015-04-13 DIAGNOSIS — R109 Unspecified abdominal pain: Secondary | ICD-10-CM | POA: Diagnosis not present

## 2015-04-13 DIAGNOSIS — R748 Abnormal levels of other serum enzymes: Secondary | ICD-10-CM | POA: Diagnosis not present

## 2015-04-13 DIAGNOSIS — R0683 Snoring: Secondary | ICD-10-CM | POA: Diagnosis not present

## 2015-04-13 DIAGNOSIS — E669 Obesity, unspecified: Secondary | ICD-10-CM | POA: Diagnosis not present

## 2015-04-13 DIAGNOSIS — R195 Other fecal abnormalities: Secondary | ICD-10-CM | POA: Diagnosis not present

## 2015-04-13 DIAGNOSIS — K219 Gastro-esophageal reflux disease without esophagitis: Secondary | ICD-10-CM

## 2015-04-13 DIAGNOSIS — R0602 Shortness of breath: Secondary | ICD-10-CM

## 2015-04-13 DIAGNOSIS — IMO0001 Reserved for inherently not codable concepts without codable children: Secondary | ICD-10-CM

## 2015-04-13 NOTE — Assessment & Plan Note (Addendum)
Completely unresponsive to inhaled corticosteroid; refer to pulmonologist, because I want him very much to be able to get back into previous physical conditioning; CXR ordered today

## 2015-04-13 NOTE — Assessment & Plan Note (Signed)
Improved per patient; no mention of sleep study suggested by ENT; [facility] and see if [facility] think sleep study would be warranted as well

## 2015-04-13 NOTE — Assessment & Plan Note (Addendum)
Try DASH guidelines, work on modest weight loss; return in 3 months

## 2015-04-13 NOTE — Assessment & Plan Note (Addendum)
Refer to pulmonologist; CXR ordered; he has mild obstruction on spirometry; however, he has exposures from firefighting in the past; will appreciate work-up and treatment by pulmonologist

## 2015-04-13 NOTE — Patient Instructions (Addendum)
Please do ask Dr. Shelle Iron or his PA if they want you to switch to pantoprazole Consider a low FODMAP diet You received the vaccine to protect against tetanus and diphtheria and pertussis today; the tetanus and diphtheria portions will provide protection up to ten years, and the pertussis component will give you protection against whooping cough for life Your goal blood pressure is less than 140 mmHg on top and 90 on the bottom Try to follow the DASH guidelines (DASH stands for Dietary Approaches to Stop Hypertension) Try to limit the sodium in your diet.  Ideally, consume less than 1.5 grams (less than 1,500mg ) per day. Do not add salt when cooking or at the table.  Check the sodium amount on labels when shopping, and choose items lower in sodium when given a choice. Avoid or limit foods that already contain a lot of sodium. Eat a diet rich in fruits and vegetables and whole grains.   DASH Eating Plan DASH stands for "Dietary Approaches to Stop Hypertension." The DASH eating plan is a healthy eating plan that has been shown to reduce high blood pressure (hypertension). Additional health benefits may include reducing the risk of type 2 diabetes mellitus, heart disease, and stroke. The DASH eating plan may also help with weight loss. WHAT DO I NEED TO KNOW ABOUT THE DASH EATING PLAN? For the DASH eating plan, you will follow these general guidelines:  Choose foods with a percent daily value for sodium of less than 5% (as listed on the food label).  Use salt-free seasonings or herbs instead of table salt or sea salt.  Check with your health care provider or pharmacist before using salt substitutes.  Eat lower-sodium products, often labeled as "lower sodium" or "no salt added."  Eat fresh foods.  Eat more vegetables, fruits, and low-fat dairy products.  Choose whole grains. Look for the word "whole" as the first word in the ingredient list.  Choose fish and skinless chicken or [place] more often  than red meat. Limit fish, poultry, and meat to 6 oz (170 g) each day.  Limit sweets, desserts, sugars, and sugary drinks.  Choose heart-healthy fats.  Limit cheese to 1 oz (28 g) per day.  Eat more home-cooked food and less restaurant, buffet, and fast food.  Limit fried foods.  Cook foods using methods other than frying.  Limit canned vegetables. If you do use them, rinse them well to decrease the sodium.  When eating at a restaurant, ask that your food be prepared with less salt, or no salt if possible. WHAT FOODS CAN I EAT? Seek help from a dietitian for individual calorie needs. Grains Whole grain or whole wheat bread. Brown rice. Whole grain or whole wheat pasta. Quinoa, bulgur, and whole grain cereals. Low-sodium cereals. Corn or whole wheat flour tortillas. Whole grain cornbread. Whole grain crackers. Low-sodium crackers. Vegetables Fresh or frozen vegetables (raw, steamed, roasted, or grilled). Low-sodium or reduced-sodium tomato and vegetable juices. Low-sodium or reduced-sodium tomato sauce and paste. Low-sodium or reduced-sodium canned vegetables.  Fruits All fresh, canned (in natural juice), or frozen fruits. Meat and Other Protein Products Ground beef (85% or leaner), grass-fed beef, or beef trimmed of fat. Skinless chicken or [place]. Ground chicken or [place]. Pork trimmed of fat. All fish and seafood. Eggs. Dried beans, peas, or lentils. Unsalted nuts and seeds. Unsalted canned beans. Dairy Low-fat dairy products, such as skim or 1% milk, 2% or reduced-fat cheeses, low-fat ricotta or cottage cheese, or plain low-fat yogurt. Low-sodium or  reduced-sodium cheeses. Fats and Oils Tub margarines without trans fats. Light or reduced-fat mayonnaise and salad dressings (reduced sodium). Avocado. Safflower, olive, or canola oils. Natural peanut or almond butter. Other Unsalted popcorn and pretzels. The items listed above may not be a complete list of recommended foods or  beverages. Contact your dietitian for more options. WHAT FOODS ARE NOT RECOMMENDED? Grains White bread. White pasta. White rice. Refined cornbread. Bagels and croissants. Crackers that contain trans fat. Vegetables Creamed or fried vegetables. Vegetables in a cheese sauce. Regular canned vegetables. Regular canned tomato sauce and paste. Regular tomato and vegetable juices. Fruits Dried fruits. Canned fruit in light or heavy syrup. Fruit juice. Meat and Other Protein Products Fatty cuts of meat. Ribs, chicken wings, bacon, sausage, bologna, salami, chitterlings, fatback, hot dogs, bratwurst, and packaged luncheon meats. Salted nuts and seeds. Canned beans with salt. Dairy Whole or 2% milk, cream, half-and-half, and cream cheese. Whole-fat or sweetened yogurt. Full-fat cheeses or blue cheese. Nondairy creamers and whipped toppings. Processed cheese, cheese spreads, or cheese curds. Condiments Onion and garlic salt, seasoned salt, table salt, and sea salt. Canned and packaged gravies. Worcestershire sauce. Tartar sauce. Barbecue sauce. Teriyaki sauce. Soy sauce, including reduced sodium. Steak sauce. Fish sauce. Oyster sauce. Cocktail sauce. Horseradish. Ketchup and mustard. Meat flavorings and tenderizers. Bouillon cubes. Hot sauce. Tabasco sauce. Marinades. Taco seasonings. Relishes. Fats and Oils Butter, stick margarine, lard, shortening, ghee, and bacon fat. Coconut, palm kernel, or palm oils. Regular salad dressings. Other Pickles and olives. Salted popcorn and pretzels. The items listed above may not be a complete list of foods and beverages to avoid. Contact your dietitian for more information. WHERE CAN I FIND MORE INFORMATION? National Heart, Lung, and Blood Institute: CableProm[employer].itwww.nhlbi.nih.gov/health/health-topics/topics/dash/   This information is not intended to replace advice given to you by your health care provider. Make sure you discuss any questions you have with your health care  provider.   Document Released: 03/07/2011 Document Revised: 04/08/2014 Document Reviewed: 01/20/2013 Elsevier Interactive Patient Education [employer]2016 Elsevier Inc.

## 2015-04-13 NOTE — Assessment & Plan Note (Signed)
Encouraged modest weight loss; will recheck in 3 months

## 2015-04-13 NOTE — Assessment & Plan Note (Signed)
Dr. Shelle Ironein (GI) mentioned using pantoprazole in his colonoscopy note; I will defer changing or prescribing PPIs to GI; try to limit triggers; discussed that he might consider using 1/4 teaspoon baking soda to neutralize acidity in spaghetti sauce, but take care to not overdo that; biopsies from EGD pending

## 2015-04-13 NOTE — Progress Notes (Signed)
BP 134/93 mmHg  Pulse 79  Temp(Src) 97.2 F (36.2 C)  Wt 231 lb (104.781 kg)  SpO2 98%   Subjective:    Patient ID: Travis Dorsey, male    DOB: October 12, 1970, 45 y.o.   MRN: [id number]  HPI: Travis Dorsey is a 45 y.o. male  Chief Complaint  Patient presents with  . Follow-up    Patient had EGD and Colonoscopy last week. He states his stomach issues are about the same. He still wonders if it could be your gallbladder.  . Shortness of Breath    He states he finished taking the Flovent inhaler.  . Immunizations    He is willing to update his teatnus today.   He has already had the EGD and colonoscopy; was told he had a small hiatal hernia; we reviewed the note together; I put him on 20 mg omeprazole, and then GI put him on omeprazole 40 mg BID; he has quit [place] foods, tons of tomatoes in their basement  No blood in the stool; no abdominal pain  He wonders if this could be his gallbladder; when he goes out to eat, he has to be careful about where he eats and when he'll need to get to the bathroom; he talked to GI about his symptoms; I asked if particularly fatty, and he says it doesn't matter where he eats; he told GI about his symptoms; we discussed gallbladder symptoms and he does not have typical symptoms  He has been doing the inhaler, two puffs twice a day; he is about the same; after he did the test, his chest hurt for a week and a half; he can't breathe when he runs; sore spot on the right side of the rib right against the sternum; has new mattress every two years; he sleeps on his side; he starts on his back; he snores and ends up on the right side; the snoring has quit a lot since taking the inhaler; exposures over the years, smoke, chemicals  [facility] never gave him the nasal spray; he went to Dr. Andee Poles and he didn't see a polyp; did not recommend sleep study  BP up a little today; trying to wean off of dip; 160/117 at the colonoscopy and had three nurses around  him, sticking him and nervous about being put to sleep, pouring sweat, had been up since 4 am doing the prep, no wonder BP was up; at Dr. Gregary Cromer office, 118/79  Relevant past medical, surgical, family and social history reviewed and updated as indicated. Interim medical history since our last visit reviewed. Allergies and medications reviewed and updated.  Review of Systems Per HPI unless specifically indicated above     Objective:    BP 134/93 mmHg  Pulse 79  Temp(Src) 97.2 F (36.2 C)  Wt 231 lb (104.781 kg)  SpO2 98%  Wt Readings from Last 3 Encounters:  04/13/15 231 lb (104.781 kg)  03/01/15 236 lb (107.049 kg)  02/16/15 232 lb (105.235 kg)    Physical Exam  Constitutional: He appears well-developed and well-nourished. No distress.  Obese, but weight down 5 pounds over the Christmas holidays  HENT:  Head: Normocephalic and atraumatic.  Mouth/Throat: Mucous membranes are normal.  Very prominent turbinate on the right (but not a polyp, per patient after evaluation with ENT)  Eyes: EOM are normal. No scleral icterus.  Neck: No thyromegaly present.  Cardiovascular: Normal rate and regular rhythm.   Pulmonary/Chest: Effort normal and breath sounds normal. He exhibits  tenderness and laceration. Bony tenderness: focal tenderness over attachment site of 5th rib on the right, costochondral junction.  Abdominal: Soft. Bowel sounds are normal. He exhibits no distension.  Musculoskeletal: He exhibits no edema.  Neurological: He displays no tremor. Coordination normal.  Skin: Skin is warm and dry. No cyanosis. No pallor. Nails show no clubbing.  No nailbed pallor  Psychiatric: He has a normal mood and affect. His behavior is normal. Judgment and thought content normal.    Results for orders placed or performed in visit on 02/16/15  Helicobacter Pylori Antibody, IGM  Result Value Ref Range   H pylori, IgM Abs <9.0 0.0 - 8.9 units  CBC with Differential/Platelet  Result Value Ref  Range   WBC 5.6 3.4 - 10.8 x10E3/uL   RBC 5.28 4.14 - 5.80 x10E6/uL   Hemoglobin 14.6 12.6 - 17.7 g/dL   Hematocrit 16.1 09.6 - 51.0 %   MCV 81 79 - 97 fL   MCH 27.7 26.6 - 33.0 pg   MCHC 34.0 31.5 - 35.7 g/dL   RDW 04.5 40.9 - 81.1 %   Platelets 295 150 - 379 x10E3/uL   Neutrophils 53 %   Lymphs 32 %   Monocytes 11 %   Eos 3 %   Basos 1 %   Neutrophils Absolute 3.0 1.4 - 7.0 x10E3/uL   Lymphocytes Absolute 1.8 0.7 - 3.1 x10E3/uL   Monocytes Absolute 0.6 0.1 - 0.9 x10E3/uL   EOS (ABSOLUTE) 0.2 0.0 - 0.4 x10E3/uL   Basophils Absolute 0.0 0.0 - 0.2 x10E3/uL   Immature Granulocytes 0 %   Immature Grans (Abs) 0.0 0.0 - 0.1 x10E3/uL  Comprehensive metabolic panel  Result Value Ref Range   Glucose 124 (H) 65 - 99 mg/dL   BUN 12 6 - 24 mg/dL   Creatinine, Ser 9.14 (H) 0.76 - 1.27 mg/dL   GFR calc non Af Amer 66 >59 mL/min/1.73   GFR calc Af Amer 76 >59 mL/min/1.73   BUN/Creatinine Ratio 9 9 - 20   Sodium 141 136 - 144 mmol/L   Potassium 4.4 3.5 - 5.2 mmol/L   Chloride 99 97 - 106 mmol/L   CO2 24 18 - 29 mmol/L   Calcium 9.8 8.7 - 10.2 mg/dL   Total Protein 7.2 6.0 - 8.5 g/dL   Albumin 4.7 3.5 - 5.5 g/dL   Globulin, Total 2.5 1.5 - 4.5 g/dL   Albumin/Globulin Ratio 1.9 1.1 - 2.5   Bilirubin Total 0.2 0.0 - 1.2 mg/dL   Alkaline Phosphatase 88 39 - 117 IU/L   AST 26 0 - 40 IU/L   ALT 36 0 - 44 IU/L      Assessment & Plan:   Problem List Items Addressed This Visit      Cardiovascular and Mediastinum   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    Completely unresponsive to inhaled corticosteroid; refer to pulmonologist, because I want him very much to be able to get back into previous physical conditioning; CXR ordered today      Relevant Orders   Ambulatory referral to Pulmonology   DG Chest 2 View     Digestive   Esophageal reflux    Dr. Shelle Iron (GI) mentioned using pantoprazole in his colonoscopy note; I will defer changing or prescribing PPIs to GI; try to  limit triggers; discussed that he might consider using 1/4 teaspoon baking soda to neutralize acidity in spaghetti sauce, but take care to not overdo that; biopsies from EGD pending  Other   Abnormal stools    Continue to f/u with GI; biopsies from colon pending      Abdominal cramps    Continue to f/u with GI; low FODMAP diet given, since this was discussed in colonoscopy note      Elevated blood pressure    Try DASH guidelines, work on modest weight loss; return in 3 months      SOB (shortness of breath) - Primary    Refer to pulmonologist; CXR ordered; he has mild obstruction on spirometry; however, he has exposures from firefighting in the past; will appreciate work-up and treatment by pulmonologist      Relevant Orders   Ambulatory referral to Pulmonology   DG Chest 2 View   Obesity    Encouraged modest weight loss; will recheck in 3 months      Elevated serum creatinine    Mild elevation of Cr noted on Nov labs; may be secondary to increased muscle bulk/build; however, will recheck at f/u in 3 months      Snoring    Improved per patient; no mention of sleep study suggested by ENT; will have [facility] and see if they think sleep study would be warranted as well       Other Visit Diagnoses    Need for Tdap vaccination        Tetanus and diphtheria components good for 10 years, pertussis booster good for life; vaccine given today    Relevant Orders    Tdap vaccine greater than or equal to 7yo IM (Completed)       Follow up plan: Return in about 3 months (around 07/12/2015) for BP.  An after-visit summary was printed and given to the patient at check-out.  Please see the patient instructions which may contain other information and recommendations beyond what is mentioned above in the assessment and plan.

## 2015-04-13 NOTE — Assessment & Plan Note (Signed)
Mild elevation of Cr noted on Nov labs; may be secondary to increased muscle bulk/build; however, will recheck at f/u in 3 months

## 2015-04-13 NOTE — Assessment & Plan Note (Signed)
Continue to f/u with GI; low FODMAP diet given, since this was discussed in colonoscopy note

## 2015-04-13 NOTE — Assessment & Plan Note (Signed)
Continue to f/u with GI; biopsies from colon pending

## 2015-05-12 ENCOUNTER — Ambulatory Visit (INDEPENDENT_AMBULATORY_CARE_PROVIDER_SITE_OTHER): Payer: BLUE CROSS/BLUE SHIELD | Admitting: Internal Medicine

## 2015-05-12 ENCOUNTER — Encounter: Payer: Self-pay | Admitting: Internal Medicine

## 2015-05-12 ENCOUNTER — Encounter (INDEPENDENT_AMBULATORY_CARE_PROVIDER_SITE_OTHER): Payer: Self-pay

## 2015-05-12 VITALS — BP 128/88 | HR 81 | Ht 73.0 in | Wt 232.0 lb

## 2015-05-12 DIAGNOSIS — R0600 Dyspnea, unspecified: Secondary | ICD-10-CM

## 2015-05-12 DIAGNOSIS — R918 Other nonspecific abnormal finding of lung field: Secondary | ICD-10-CM | POA: Insufficient documentation

## 2015-05-12 DIAGNOSIS — R0609 Other forms of dyspnea: Secondary | ICD-10-CM | POA: Diagnosis not present

## 2015-05-12 MED ORDER — ALBUTEROL SULFATE HFA 108 (90 BASE) MCG/ACT IN AERS: 2.0000 | INHALATION_SPRAY | RESPIRATORY_TRACT | Status: DC | PRN

## 2015-05-12 NOTE — Patient Instructions (Signed)
Follow up with Dr. Dema Severin in:6-8 wks -  Avoid nicotine - continue with diet, exercise, healthy lifestyle - CT chest with contrast prior to follow-up visit given chronic shortness of breath and history of pulmonary nodules  - albuterol inhaler - 2puff every 3-4 hours as needed for shortness of breath\wheezing\recurrent cough -  May use albuterol inhaler prior to exercise , 2 puffs prior to exercise ( at least 15 minutes ), and 2 puffs after exercise if needed for shortness of breath/wheezing -  Pulmonary function testing prior to follow-up visit -  6 Minute walk test prior to follow-up visit

## 2015-05-12 NOTE — Assessment & Plan Note (Signed)
Multiple right upper lobe pulmonary nodules   - seen from review of CT chest March 2010   - inflammatory /infectious etiology at that time    plan:   - CT chest with contrast prior to follow-up visit
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
## 2015-05-12 NOTE — Assessment & Plan Note (Addendum)
Differential diagnosis includes - COPD, asthma, other obstructive lung diseases, exercise-induced bronchospasms/asthma, allergic symptoms.  At this time I believe patient is mostly having exercise induced bronchospasm or exertional asthma. He has had a trial of Flovent from his primary care physician for 6 weeks with no significant relief in symptoms. His symptoms are mostly with dyspnea on exertion.    Plan:  - albuterol as needed 20 minutes prior to intense workout , 2 puffs   - continue with diet, exercise, healthy lifestyle   - pulmonary function testing   - 6 minute walk test  - review of his images showed he had a CT scan in 2010 with pulmonary nodules , will follow up CT prior to next visit   - chest x-ray reviewed shows no significant hyperinflation or flattening of the diaphragms  - stop all forms of tobacco (chewing, dip), vapor, ecigs, etc.

## 2015-05-30 ENCOUNTER — Ambulatory Visit: Payer: BLUE CROSS/BLUE SHIELD | Admitting: Pulmonary Disease

## 2015-06-21 ENCOUNTER — Telehealth: Payer: Self-pay | Admitting: *Deleted

## 2015-06-21 ENCOUNTER — Ambulatory Visit
Admission: RE | Admit: 2015-06-21 | Discharge: 2015-06-21 | Disposition: A | Discharge status: Home / Self Care | Payer: BLUE CROSS/BLUE SHIELD | Source: Ambulatory Visit | Attending: Internal Medicine | Admitting: Internal Medicine

## 2015-06-21 ENCOUNTER — Ambulatory Visit (INDEPENDENT_AMBULATORY_CARE_PROVIDER_SITE_OTHER): Payer: BLUE CROSS/BLUE SHIELD | Admitting: *Deleted

## 2015-06-21 DIAGNOSIS — R0600 Dyspnea, unspecified: Secondary | ICD-10-CM

## 2015-06-21 DIAGNOSIS — R918 Other nonspecific abnormal finding of lung field: Secondary | ICD-10-CM

## 2015-06-21 DIAGNOSIS — R0609 Other forms of dyspnea: Secondary | ICD-10-CM | POA: Insufficient documentation

## 2015-06-21 LAB — PULMONARY FUNCTION TEST
DL/VA % pred: 99 %
DL/VA: 4.67 ml/min/mmHg/L
DLCO unc % pred: 96 %
DLCO unc: 32.47 ml/min/mmHg
FEF 25-75 Post: 3.54 L/sec
FEF 25-75 Pre: 3.07 L/sec
FEF2575-%Change-Post: 15 %
FEF2575-%Pred-Post: 89 %
FEF2575-%Pred-Pre: 77 %
FEV1-%Change-Post: 3 %
FEV1-%Pred-Post: 95 %
FEV1-%Pred-Pre: 92 %
FEV1-Post: 4.1 L
FEV1-Pre: 3.95 L
FEV1FVC-%Change-Post: 4 %
FEV1FVC-%Pred-Pre: 94 %
FEV6-%Change-Post: 0 %
FEV6-%Pred-Post: 99 %
FEV6-%Pred-Pre: 100 %
FEV6-Post: 5.25 L
FEV6-Pre: 5.27 L
FEV6FVC-%Change-Post: 0 %
FEV6FVC-%Pred-Post: 103 %
FEV6FVC-%Pred-Pre: 102 %
FVC-%Change-Post: -1 %
FVC-%Pred-Post: 97 %
FVC-%Pred-Pre: 98 %
FVC-Post: 5.25 L
FVC-Pre: 5.32 L
Post FEV1/FVC ratio: 78 %
Post FEV6/FVC ratio: 100 %
Pre FEV1/FVC ratio: 74 %
Pre FEV6/FVC Ratio: 99 %
RV % pred: 98 %
RV: 1.9 L
TLC % pred: 100 %
TLC: 7.18 L

## 2015-06-21 MED ORDER — IOPAMIDOL (ISOVUE-370) INJECTION 76%
75.0000 mL | Freq: Once | INTRAVENOUS | Status: AC | PRN
  Administered 2015-06-21: 75 mL via INTRAVENOUS

## 2015-06-21 NOTE — Telephone Encounter (Signed)
-----   Message from Stephanie AcreVishal Mungal, MD sent at 06/21/2015  1:08 PM EDT ----- Regarding: CT Chest results Please inform patient that I reviewed his CT Chest. No tumor, no masses, no abnormal findings, normal CT.  Thank you

## 2015-06-21 NOTE — Progress Notes (Signed)
SMW performed today. 

## 2015-06-21 NOTE — Telephone Encounter (Signed)
Pt informed of results. Nothing further needed. 

## 2015-06-21 NOTE — Progress Notes (Signed)
PFT performed today. 

## 2015-06-23 ENCOUNTER — Ambulatory Visit (INDEPENDENT_AMBULATORY_CARE_PROVIDER_SITE_OTHER): Payer: BLUE CROSS/BLUE SHIELD | Admitting: Internal Medicine

## 2015-06-23 ENCOUNTER — Encounter: Payer: Self-pay | Admitting: Internal Medicine

## 2015-06-23 VITALS — BP 130/84 | HR 67 | Ht 73.0 in | Wt 238.6 lb

## 2015-06-23 DIAGNOSIS — R0600 Dyspnea, unspecified: Secondary | ICD-10-CM

## 2015-06-23 DIAGNOSIS — R0602 Shortness of breath: Secondary | ICD-10-CM | POA: Diagnosis not present

## 2015-06-23 DIAGNOSIS — R0609 Other forms of dyspnea: Secondary | ICD-10-CM

## 2015-06-23 NOTE — Assessment & Plan Note (Addendum)
Differential diagnosis includes -  exercise-induced bronchospasms/asthma, allergic symptoms, deconditioning  I have reviewed all the pertinent exams such as PFTs, 6 minute walk test, CT chest with the patient. There are no abnormalities on any at this test is concerning for dyspnea on exertion from a pulmonary standpoint.  At this time I believe patient is mostly having exercise induced bronchospasm or exertional asthma. He has had a trial of Flovent from his primary care physician for 6 weeks with no significant relief in symptoms. His symptoms are mostly with dyspnea on exertion, today we discussed further workup from a cardiac standpoint, we have agreed for an echocardiogram and a stress/treadmill EKG at [facility].    Plan:  - albuterol as needed 20 minutes prior to intense workout , 2 puffs   - continue with diet, exercise, healthy lifestyle  - Echocardiogram and stress/treadmill EKG - stop all forms of tobacco (chewing, dip), vapor, ecigs, etc.

## 2015-06-23 NOTE — Patient Instructions (Signed)
Follow up with Dr. Dema SeverinMungal in: 3 months - we will set you for a ECHO cardiogram and treadmill stress EKG for dyspnea on exertion - cont with diet and exercise - please cut back and eventually stop chewing tobacco.

## 2015-06-23 NOTE — Assessment & Plan Note (Signed)
See plan for dyspnea on exertion 

## 2015-06-23 NOTE — Progress Notes (Signed)
[name]      MRN# [id number] Travis SicilianJoshua Engram Dorsey 1970/04/12   CC: Chief Complaint  Patient presents with  . Follow-up    pft & smw results. no new concerns today.       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 Normal PFTs, normal 6 minute walk test, normal CT scan of chest.   Events since last clinic visit: Presents today for follow-up visit of his dyspnea on exertion, along with results of his pulmonary function tests, 6 minute walk test and a CT scan of chest. Briefly at his last visit, he had seen pulmonary for workup of chronic dyspnea on exertion over the last 3-4 years. Patient is a former IT sales professionalfirefighter, he is in fairly good shape, he states that his dyspnea on exertion is worse with running and going up inclines, however he is very active and going to the gym and riding his bike. Today he states that he denies any worsening shortness of breath, still with chewing tobacco. He states again his major concern is dyspnea on exertion especially with running and intense exercise.    Medication:   No current outpatient prescriptions on file.   Review of Systems  Constitutional: Negative for fever, weight loss and malaise/fatigue.  HENT: Negative for hearing loss.   Eyes: Negative for blurred vision and double vision.  Respiratory: Positive for shortness of breath. Negative for cough, hemoptysis, sputum production and wheezing.   Cardiovascular: Negative for chest pain, palpitations, orthopnea and claudication.  Gastrointestinal: Negative for heartburn.  Genitourinary: Negative for dysuria.  Neurological: Negative for dizziness, weakness and headaches.  Endo/Heme/Allergies: Does not bruise/bleed easily.      Allergies:  Penicillins  Physical Examination:  VS: BP 130/84 mmHg  Pulse 67  Ht 6\' 1"  (1.854 m)  Wt 238 lb 9.6  oz (108.228 kg)  BMI 31.49 kg/m2  SpO2 97%  General Appearance: No distress  HEENT: PERRLA, no ptosis, no other lesions noticed Pulmonary:normal breath sounds., diaphragmatic excursion normal.No wheezing, No rales   Cardiovascular:  Normal S1,S2.  No m/r/g.     Abdomen:Exam: Benign, Soft, non-tender, No masses  Skin:   warm, no rashes, no ecchymosis  Extremities: normal, no cyanosis, clubbing, warm with normal capillary refill.      Rad results: (The following images and results were reviewed by Dr. Dema SeverinMungal on 06/23/2015). CT Chest 06/21/15 IMPRESSION: No acute finding of the chest CT.  Resolution of prior right upper lobe airspace disease.  PFT at this time by ATS criteria, is evident for:  No significant obstruction or restriction, no significant response to BDs, no reduction in DLCO.    Pre-Bronch       Post-Bronch     FEV1: Actual 3.95   Predicted 92%   Actual 4.1 Pred 95%  3%Chg  FEV1/FVC: Actual 74   Predicted 94%   Actual 78 Pred 98%  4%Chg  RV: Actual 1.9   Predicted 98%     TLC: Actual 7.18   Predicted 100%     RV/TLC ([place])(%): Actual 26 Predicted 98%    DLCO2:cor: Actual 32.47   Predicted 96%   6MWT - lowest sat 96%, distance 32668m/1289ft, highest HR 89, no complaints after test.    Assessment and Plan: 45 year old male seen for further workup of dyspnea on exertion. SOB (shortness of breath) See plan for dyspnea on exertion.   DOE (dyspnea on exertion)  Differential diagnosis includes -  exercise-induced bronchospasms/asthma, allergic symptoms, deconditioning  I have reviewed all the pertinent exams such as PFTs, 6 minute walk test, CT chest with the patient. There are no abnormalities on any at this test is concerning for dyspnea on exertion from a pulmonary standpoint.  At this time I believe patient is mostly having exercise induced bronchospasm or exertional asthma. He has had a trial of Flovent from his primary care physician for 6 weeks with no significant  relief in symptoms. His symptoms are mostly with dyspnea on exertion, today we discussed further workup from a cardiac standpoint, we have agreed for an echocardiogram and a stress/treadmill EKG at [facility] cardiology.    Plan:  - albuterol as needed 20 minutes prior to intense workout , 2 puffs   - continue with diet, exercise, healthy lifestyle  - Echocardiogram and stress/treadmill EKG - stop all forms of tobacco (chewing, dip), vapor, ecigs, etc.        Updated Medication List Outpatient Encounter Prescriptions as of 06/23/2015  Medication Sig  . [DISCONTINUED] albuterol (PROVENTIL HFA;VENTOLIN HFA) 108 (90 Base) MCG/ACT inhaler Inhale 2 puffs into the lungs every 4 (four) hours as needed for wheezing or shortness of breath. (Patient not taking: Reported on 06/23/2015)  . [DISCONTINUED] omeprazole (PRILOSEC) 20 MG capsule Take 1 capsule (20 mg total) by mouth 2 (two) times daily before a meal. (Patient not taking: Reported on 06/23/2015)   No facility-administered encounter medications on file as of 06/23/2015.    Orders for this visit: Orders Placed This Encounter  Procedures  . Exercise Tolerance Test    Standing Status: Future     Number of Occurrences:      Standing Expiration Date: 09/22/2016    Order Specific Question:  Where should this test be performed    Answer:  [facility]    Order Specific Question:  Stress with Dobutamine or Treadmill with exercise?    Answer:  Treadmill w/ exercise  . ECHOCARDIOGRAM COMPLETE    Standing Status: Future     Number of Occurrences:      Standing Expiration Date: 09/22/2016    Order Specific Question:  Where should this test be performed    Answer:  [facility]    Order Specific Question:  Complete or Limited study?    Answer:  Complete    Order Specific Question:  With Image Enhancing Agent or without Image Enhancing Agent?    Answer:  With Image Enhancing Agent    Order Specific Question:  Reason for exam-Echo    Answer:   Dyspnea  786.09 / R06.00    Thank  you for the visitation and for allowing  [facility] to assist in the care of your patient. Our recommendations are noted above.  Please contact us if we can be of further service.  Stephanie Acre, MD [facility] Office Number: [phone]  Note: This note was prepared with Dragon dictation along with smaller phrase technology. Any transcriptional errors that result from this process are unintentional.

## 2015-07-14 ENCOUNTER — Ambulatory Visit: Payer: BLUE CROSS/BLUE SHIELD | Admitting: Family Medicine

## 2015-07-18 ENCOUNTER — Other Ambulatory Visit: Payer: Self-pay

## 2015-07-18 ENCOUNTER — Ambulatory Visit (INDEPENDENT_AMBULATORY_CARE_PROVIDER_SITE_OTHER): Payer: BLUE CROSS/BLUE SHIELD

## 2015-07-18 DIAGNOSIS — R0609 Other forms of dyspnea: Secondary | ICD-10-CM | POA: Diagnosis not present

## 2015-07-18 DIAGNOSIS — R0600 Dyspnea, unspecified: Secondary | ICD-10-CM

## 2015-07-19 LAB — EXERCISE TOLERANCE TEST
Estimated workload: 8.8 METS
Exercise duration (min): 7 min
Exercise duration (sec): 12 s
MPHR: 178 {beats}/min
Peak HR: 157 {beats}/min
Percent HR: 88 %
Rest HR: 95 {beats}/min

## 2015-07-31 ENCOUNTER — Telehealth: Payer: Self-pay | Admitting: Internal Medicine

## 2015-07-31 NOTE — Telephone Encounter (Signed)
Please advise on results

## 2015-07-31 NOTE — Telephone Encounter (Signed)
Pt would like results from stress test and echocardiogram. Please call.

## 2015-08-01 NOTE — Telephone Encounter (Signed)
Pt informed of results. Nothing further needed. 

## 2015-08-01 NOTE — Telephone Encounter (Signed)
Patient with a normal ECHO and normal stress test.  No significant abnormalities found with either test.   Thank you -VM

## 2016-01-15 DIAGNOSIS — M9905 Segmental and somatic dysfunction of pelvic region: Secondary | ICD-10-CM | POA: Diagnosis not present

## 2016-01-15 DIAGNOSIS — M955 Acquired deformity of pelvis: Secondary | ICD-10-CM | POA: Diagnosis not present

## 2016-01-15 DIAGNOSIS — M5117 Intervertebral disc disorders with radiculopathy, lumbosacral region: Secondary | ICD-10-CM | POA: Diagnosis not present

## 2016-01-15 DIAGNOSIS — M9903 Segmental and somatic dysfunction of lumbar region: Secondary | ICD-10-CM | POA: Diagnosis not present

## 2016-01-16 DIAGNOSIS — M9903 Segmental and somatic dysfunction of lumbar region: Secondary | ICD-10-CM | POA: Diagnosis not present

## 2016-01-16 DIAGNOSIS — M955 Acquired deformity of pelvis: Secondary | ICD-10-CM | POA: Diagnosis not present

## 2016-01-16 DIAGNOSIS — M9905 Segmental and somatic dysfunction of pelvic region: Secondary | ICD-10-CM | POA: Diagnosis not present

## 2016-01-16 DIAGNOSIS — M5117 Intervertebral disc disorders with radiculopathy, lumbosacral region: Secondary | ICD-10-CM | POA: Diagnosis not present

## 2016-01-17 DIAGNOSIS — M5117 Intervertebral disc disorders with radiculopathy, lumbosacral region: Secondary | ICD-10-CM | POA: Diagnosis not present

## 2016-01-17 DIAGNOSIS — M9903 Segmental and somatic dysfunction of lumbar region: Secondary | ICD-10-CM | POA: Diagnosis not present

## 2016-01-17 DIAGNOSIS — M955 Acquired deformity of pelvis: Secondary | ICD-10-CM | POA: Diagnosis not present

## 2016-01-17 DIAGNOSIS — M9905 Segmental and somatic dysfunction of pelvic region: Secondary | ICD-10-CM | POA: Diagnosis not present

## 2016-01-22 DIAGNOSIS — M955 Acquired deformity of pelvis: Secondary | ICD-10-CM | POA: Diagnosis not present

## 2016-01-22 DIAGNOSIS — M9903 Segmental and somatic dysfunction of lumbar region: Secondary | ICD-10-CM | POA: Diagnosis not present

## 2016-01-22 DIAGNOSIS — M5117 Intervertebral disc disorders with radiculopathy, lumbosacral region: Secondary | ICD-10-CM | POA: Diagnosis not present

## 2016-01-22 DIAGNOSIS — M9905 Segmental and somatic dysfunction of pelvic region: Secondary | ICD-10-CM | POA: Diagnosis not present

## 2016-01-24 DIAGNOSIS — M9905 Segmental and somatic dysfunction of pelvic region: Secondary | ICD-10-CM | POA: Diagnosis not present

## 2016-01-24 DIAGNOSIS — M9903 Segmental and somatic dysfunction of lumbar region: Secondary | ICD-10-CM | POA: Diagnosis not present

## 2016-01-24 DIAGNOSIS — M955 Acquired deformity of pelvis: Secondary | ICD-10-CM | POA: Diagnosis not present

## 2016-01-24 DIAGNOSIS — M5117 Intervertebral disc disorders with radiculopathy, lumbosacral region: Secondary | ICD-10-CM | POA: Diagnosis not present

## 2016-01-26 DIAGNOSIS — M9903 Segmental and somatic dysfunction of lumbar region: Secondary | ICD-10-CM | POA: Diagnosis not present

## 2016-01-26 DIAGNOSIS — M9905 Segmental and somatic dysfunction of pelvic region: Secondary | ICD-10-CM | POA: Diagnosis not present

## 2016-01-26 DIAGNOSIS — M955 Acquired deformity of pelvis: Secondary | ICD-10-CM | POA: Diagnosis not present

## 2016-01-26 DIAGNOSIS — M5117 Intervertebral disc disorders with radiculopathy, lumbosacral region: Secondary | ICD-10-CM | POA: Diagnosis not present

## 2016-01-29 DIAGNOSIS — M9903 Segmental and somatic dysfunction of lumbar region: Secondary | ICD-10-CM | POA: Diagnosis not present

## 2016-01-29 DIAGNOSIS — M5117 Intervertebral disc disorders with radiculopathy, lumbosacral region: Secondary | ICD-10-CM | POA: Diagnosis not present

## 2016-01-29 DIAGNOSIS — M955 Acquired deformity of pelvis: Secondary | ICD-10-CM | POA: Diagnosis not present

## 2016-01-29 DIAGNOSIS — M9905 Segmental and somatic dysfunction of pelvic region: Secondary | ICD-10-CM | POA: Diagnosis not present

## 2016-01-31 DIAGNOSIS — M9905 Segmental and somatic dysfunction of pelvic region: Secondary | ICD-10-CM | POA: Diagnosis not present

## 2016-01-31 DIAGNOSIS — M9903 Segmental and somatic dysfunction of lumbar region: Secondary | ICD-10-CM | POA: Diagnosis not present

## 2016-01-31 DIAGNOSIS — M955 Acquired deformity of pelvis: Secondary | ICD-10-CM | POA: Diagnosis not present

## 2016-01-31 DIAGNOSIS — M5117 Intervertebral disc disorders with radiculopathy, lumbosacral region: Secondary | ICD-10-CM | POA: Diagnosis not present

## 2016-02-02 DIAGNOSIS — M955 Acquired deformity of pelvis: Secondary | ICD-10-CM | POA: Diagnosis not present

## 2016-02-02 DIAGNOSIS — M9903 Segmental and somatic dysfunction of lumbar region: Secondary | ICD-10-CM | POA: Diagnosis not present

## 2016-02-02 DIAGNOSIS — M5117 Intervertebral disc disorders with radiculopathy, lumbosacral region: Secondary | ICD-10-CM | POA: Diagnosis not present

## 2016-02-02 DIAGNOSIS — M9905 Segmental and somatic dysfunction of pelvic region: Secondary | ICD-10-CM | POA: Diagnosis not present

## 2016-02-05 DIAGNOSIS — M9905 Segmental and somatic dysfunction of pelvic region: Secondary | ICD-10-CM | POA: Diagnosis not present

## 2016-02-05 DIAGNOSIS — M955 Acquired deformity of pelvis: Secondary | ICD-10-CM | POA: Diagnosis not present

## 2016-02-05 DIAGNOSIS — M9903 Segmental and somatic dysfunction of lumbar region: Secondary | ICD-10-CM | POA: Diagnosis not present

## 2016-02-05 DIAGNOSIS — M5117 Intervertebral disc disorders with radiculopathy, lumbosacral region: Secondary | ICD-10-CM | POA: Diagnosis not present

## 2016-02-09 DIAGNOSIS — M5117 Intervertebral disc disorders with radiculopathy, lumbosacral region: Secondary | ICD-10-CM | POA: Diagnosis not present

## 2016-02-09 DIAGNOSIS — M9903 Segmental and somatic dysfunction of lumbar region: Secondary | ICD-10-CM | POA: Diagnosis not present

## 2016-02-09 DIAGNOSIS — M955 Acquired deformity of pelvis: Secondary | ICD-10-CM | POA: Diagnosis not present

## 2016-02-09 DIAGNOSIS — M9905 Segmental and somatic dysfunction of pelvic region: Secondary | ICD-10-CM | POA: Diagnosis not present

## 2016-02-13 ENCOUNTER — Other Ambulatory Visit: Payer: Self-pay | Admitting: Chiropractic Medicine

## 2016-02-13 DIAGNOSIS — M5116 Intervertebral disc disorders with radiculopathy, lumbar region: Secondary | ICD-10-CM

## 2016-02-13 DIAGNOSIS — M9903 Segmental and somatic dysfunction of lumbar region: Secondary | ICD-10-CM | POA: Diagnosis not present

## 2016-02-13 DIAGNOSIS — M5117 Intervertebral disc disorders with radiculopathy, lumbosacral region: Secondary | ICD-10-CM

## 2016-02-13 DIAGNOSIS — M955 Acquired deformity of pelvis: Secondary | ICD-10-CM | POA: Diagnosis not present

## 2016-02-13 DIAGNOSIS — M9905 Segmental and somatic dysfunction of pelvic region: Secondary | ICD-10-CM | POA: Diagnosis not present

## 2016-02-16 DIAGNOSIS — M9903 Segmental and somatic dysfunction of lumbar region: Secondary | ICD-10-CM | POA: Diagnosis not present

## 2016-02-16 DIAGNOSIS — M5117 Intervertebral disc disorders with radiculopathy, lumbosacral region: Secondary | ICD-10-CM | POA: Diagnosis not present

## 2016-02-16 DIAGNOSIS — M955 Acquired deformity of pelvis: Secondary | ICD-10-CM | POA: Diagnosis not present

## 2016-02-16 DIAGNOSIS — M9905 Segmental and somatic dysfunction of pelvic region: Secondary | ICD-10-CM | POA: Diagnosis not present

## 2016-02-19 DIAGNOSIS — M955 Acquired deformity of pelvis: Secondary | ICD-10-CM | POA: Diagnosis not present

## 2016-02-19 DIAGNOSIS — M9905 Segmental and somatic dysfunction of pelvic region: Secondary | ICD-10-CM | POA: Diagnosis not present

## 2016-02-19 DIAGNOSIS — M5117 Intervertebral disc disorders with radiculopathy, lumbosacral region: Secondary | ICD-10-CM | POA: Diagnosis not present

## 2016-02-19 DIAGNOSIS — M9903 Segmental and somatic dysfunction of lumbar region: Secondary | ICD-10-CM | POA: Diagnosis not present

## 2016-02-21 DIAGNOSIS — Z9889 Other specified postprocedural states: Secondary | ICD-10-CM | POA: Diagnosis not present

## 2016-02-21 DIAGNOSIS — M5117 Intervertebral disc disorders with radiculopathy, lumbosacral region: Secondary | ICD-10-CM | POA: Diagnosis not present

## 2016-02-26 ENCOUNTER — Ambulatory Visit: Payer: BLUE CROSS/BLUE SHIELD

## 2016-03-05 DIAGNOSIS — M545 Low back pain: Secondary | ICD-10-CM | POA: Diagnosis not present

## 2016-03-05 DIAGNOSIS — M5416 Radiculopathy, lumbar region: Secondary | ICD-10-CM | POA: Diagnosis not present

## 2016-06-15 NOTE — Progress Notes (Signed)
Travis Dorsey D.O. [facility] [address] [place], [place] [address] Phone: [phone] Subjective:    I'm seeing this patient by the request  of:  Baruch GoutyMelinda Lada, MD   CC: Low back pain  BJY:[id number]:Subjective  Travis Dorsey is a 46 y.o. male coming in with complaint of low back pain. Patient has had this for multiple years. Patient has had a laminectomy back in 2010. Patient has hand still pain for quite some time. Patient has had more mechanical symptoms for greater than 2 years with some radicular pain still occurring intermittently. Patient has seen other providers for this. Patient has been using chiropractic care with mild benefit. States that certain movements because radiation down the legs. Patient was seen by neurosurgery and they did discuss the possibility of a lumbar interbody fusion. Patient states   last imaging that I can see is a CT scan of the lumbar spine November 2010. Patient did have postsurgical changes at L5-S1 but still some mild L5 and S1 nerve root impingement noted.  Patient initially was brought a new MRI with him today. Did show that patientExercise prescription:  You should adjust the intensity of your exercise based on your heart rate. [employer] sports medicine recommends keeping your heart rate between 70-80% of its maximum for 30 minutes, 3-5 times per week. Maximum heart rate = (220 - age). Multiply this number by 0.75 to get your goal heart rate. If lower, then increase the intensity of your exercise. If the number is higher, you may decrease the intensity of your exercise. At L5-S1 with broad-based bulge that could be causing potentially right S1 nerve root impingement.  Past Medical History:  Diagnosis Date  . Strep throat    Past Surgical History:  Procedure Laterality Date  . BACK SURGERY    . COLONOSCOPY  04/06/15  . ESOPHAGOGASTRODUODENOSCOPY  04/06/15  . TONSILLECTOMY     Social History   Social History  . Marital  status: Married    Spouse name: N/A  . Number of children: N/A  . Years of education: N/A   Social History Main Topics  . Smoking status: Former Smoker    Quit date: 04/04/2003  . Smokeless tobacco: Current User    Types: Chew  . Alcohol use No  . Drug use: No  . Sexual activity: Not Asked   Other Topics Concern  . None   Social History Narrative  . None   Allergies  Allergen Reactions  . Penicillins Rash   Family History  Problem Relation Age of Onset  . Diabetes Maternal Grandmother   . Stroke Maternal Grandmother   . Emphysema Paternal Grandfather   . Cancer Neg Hx   . COPD Neg Hx   . Heart disease Neg Hx   . Hypertension Neg Hx     Past medical history, social, surgical and family history all reviewed in electronic medical record.  No pertanent information unless stated regarding to the chief complaint.   Review of Systems:Review of systems updated and as accurate as of 06/17/16  No headache, visual changes, nausea, vomiting, diarrhea, constipation, dizziness, abdominal pain, skin rash, fevers, chills, night sweats, weight loss, swollen lymph nodes,chest pain, shortness of breath, mood changes. Positive muscle aches, body aches,  Objective  Blood pressure 122/86, pulse 84, height 6\' 1"  (1.854 m), weight 237 lb 9.6 oz (107.8 kg), SpO2 98 %. Systems examined below as of 06/17/16   General: No apparent distress alert and oriented x3 mood  and affect normal, dressed appropriately.  HEENT: Pupils equal, extraocular movements intact  Respiratory: Patient's speak in full sentences and does not appear short of breath  Cardiovascular: No lower extremity edema, non tender, no erythema  Skin: Warm dry intact with no signs of infection or rash on extremities or on axial skeleton.  Abdomen: Soft nontender  Neuro: Cranial nerves II through XII are intact, neurovascularly intact in all extremities with 2+ DTRs and 2+ pulses.  Lymph: No lymphadenopathy of posterior or anterior  cervical chain or axillae bilaterally.  Gait normal with good balance and coordination.  MSK:  Non tender with full range of motion and good stability and symmetric strength and tone of shoulders, elbows, wrist, hip, knee and ankles bilaterally.  Back Exam:  Inspection: Unremarkable  Motion: Flexion 35 deg, Extension 25 deg, Side Bending to 35 deg bilaterally,  Rotation to 35 deg bilaterally  SLR laying: Negative  XSLR laying: Negative  Palpable tenderness: Tender to palpation in the paraspinal musculature. Moderate to severe over L5-S1 right greater than left. FABER: Tightness noted of the right side but no pain. Sensory change: Gross sensation intact to all lumbar and sacral dermatomes.  Reflexes: 2+ at both patellar tendons, 2+ at achilles tendons, Babinski's downgoing.  Strength at foot  Plantar-flexion: 5/5 Dorsi-flexion: 5/5 Eversion: 5/5 Inversion: 5/5  Leg strength  Quad: 5/5 Hamstring: 5/5 Hip flexor: 5/5 Hip abductors: 5/5  Gait unremarkable.  Procedure note 97110; 15 minutes spent for Therapeutic exercises as stated in above notes.  This included exercises focusing on stretching, strengthening, with significant focus on eccentric aspects. Low back exercises that included:  Pelvic tilt/bracing instruction to focus on control of the pelvic girdle and lower abdominal muscles  Glute strengthening exercises, focusing on proper firing of the glutes without engaging the low back muscles Proper stretching techniques for maximum relief for the hamstrings, hip flexors, low back and some rotation where tolerated   Proper technique shown and discussed handout in great detail with ATC.  All questions were discussed and answered.      Impression and Recommendations:     This case required medical decision making of moderate complexity.      Note: This dictation was prepared with Dragon dictation along with smaller phrase technology. Any transcriptional errors that result from this  process are unintentional.

## 2016-06-17 ENCOUNTER — Ambulatory Visit (INDEPENDENT_AMBULATORY_CARE_PROVIDER_SITE_OTHER): Payer: BLUE CROSS/BLUE SHIELD | Admitting: Family Medicine

## 2016-06-17 ENCOUNTER — Other Ambulatory Visit (INDEPENDENT_AMBULATORY_CARE_PROVIDER_SITE_OTHER): Payer: BLUE CROSS/BLUE SHIELD

## 2016-06-17 ENCOUNTER — Encounter: Payer: Self-pay | Admitting: Family Medicine

## 2016-06-17 VITALS — BP 122/86 | HR 84 | Ht 73.0 in | Wt 237.6 lb

## 2016-06-17 DIAGNOSIS — M2550 Pain in unspecified joint: Secondary | ICD-10-CM

## 2016-06-17 DIAGNOSIS — M5416 Radiculopathy, lumbar region: Secondary | ICD-10-CM | POA: Diagnosis not present

## 2016-06-17 LAB — CBC WITH DIFFERENTIAL/PLATELET
Basophils Absolute: 0.1 10*3/uL (ref 0.0–0.1)
Basophils Relative: 1.5 % (ref 0.0–3.0)
Eosinophils Absolute: 0.2 10*3/uL (ref 0.0–0.7)
Eosinophils Relative: 3.8 % (ref 0.0–5.0)
HCT: 44.6 % (ref 39.0–52.0)
Hemoglobin: 15.2 g/dL (ref 13.0–17.0)
Lymphocytes Relative: 35.3 % (ref 12.0–46.0)
Lymphs Abs: 1.8 10*3/uL (ref 0.7–4.0)
MCHC: 34.2 g/dL (ref 30.0–36.0)
MCV: 81.7 fl (ref 78.0–100.0)
Monocytes Absolute: 0.6 10*3/uL (ref 0.1–1.0)
Monocytes Relative: 11.4 % (ref 3.0–12.0)
Neutro Abs: 2.4 10*3/uL (ref 1.4–7.7)
Neutrophils Relative %: 48 % (ref 43.0–77.0)
Platelets: 290 10*3/uL (ref 150.0–400.0)
RBC: 5.45 Mil/uL (ref 4.22–5.81)
RDW: 13.5 % (ref 11.5–15.5)
WBC: 5 10*3/uL (ref 4.0–10.5)

## 2016-06-17 LAB — SEDIMENTATION RATE: Sed Rate: 17 mm/hr — ABNORMAL HIGH (ref 0–15)

## 2016-06-17 LAB — VITAMIN D 25 HYDROXY (VIT D DEFICIENCY, FRACTURES): VITD: 29.69 ng/mL — ABNORMAL LOW (ref 30.00–100.00)

## 2016-06-17 LAB — COMPREHENSIVE METABOLIC PANEL
ALT: 36 U/L (ref 0–53)
AST: 24 U/L (ref 0–37)
Albumin: 4.7 g/dL (ref 3.5–5.2)
Alkaline Phosphatase: 81 U/L (ref 39–117)
BUN: 11 mg/dL (ref 6–23)
CO2: 28 mEq/L (ref 19–32)
Calcium: 10.1 mg/dL (ref 8.4–10.5)
Chloride: 102 mEq/L (ref 96–112)
Creatinine, Ser: 1.31 mg/dL (ref 0.40–1.50)
GFR: 63.31 mL/min (ref >60.00)
Glucose, Bld: 112 mg/dL — ABNORMAL HIGH (ref 70–99)
Potassium: 4.1 mEq/L (ref 3.5–5.1)
Sodium: 140 mEq/L (ref 135–145)
Total Bilirubin: 0.4 mg/dL (ref 0.2–1.2)
Total Protein: 7.6 g/dL (ref 6.0–8.3)

## 2016-06-17 LAB — URIC ACID: Uric Acid, Serum: 8.4 mg/dL — ABNORMAL HIGH (ref 4.0–7.8)

## 2016-06-17 LAB — TSH: TSH: 1.52 u[IU]/mL (ref 0.35–4.50)

## 2016-06-17 LAB — HEMOGLOBIN A1C: Hgb A1c MFr Bld: 7.7 % — ABNORMAL HIGH (ref 4.6–6.5)

## 2016-06-17 LAB — FERRITIN: Ferritin: 246.3 ng/mL (ref 22.0–322.0)

## 2016-06-17 LAB — C-REACTIVE PROTEIN: CRP: 0.3 mg/dL — ABNORMAL LOW (ref 0.5–20.0)

## 2016-06-17 MED ORDER — VITAMIN D (ERGOCALCIFEROL) 1.25 MG (50000 UNIT) PO CAPS: 50000.0000 [IU] | ORAL_CAPSULE | ORAL | 0 refills | Status: DC

## 2016-06-17 MED ORDER — GABAPENTIN 100 MG PO CAPS: 200.0000 mg | ORAL_CAPSULE | Freq: Every day | ORAL | 3 refills | Status: DC

## 2016-06-17 NOTE — Patient Instructions (Signed)
Good to see you.  Ice 20 minutes 2 times daily. Usually after activity and before bed. Lets get labs today  Exercises 3 times a week.  OK to bike for now pennsaid pinkie amount topically 2 times daily as needed.  Gabapentin 200mg  at night  Once weekly vitamin D for next 12 weeks.  Over the counter get  Turmeric 500mg  twice daily  Tart cherry extract any dose at night Good shoes with rigid bottom.  Dierdre HarnessKeen, Dansko, Merrell or New balance greater then 700 Stay active but avoi a lot of extension in the back for now.  See me again in 3-4 weeks.

## 2016-06-17 NOTE — Assessment & Plan Note (Signed)
Patient has known lumbar radiculopathy. Seems most recent MRI does show a potential nerve root impingement. Patient sent of the provider who went into an anterior fusion of his back. Patient like to avoid any other type of surgery possible. States though that this pain is affecting daily activities and multiple different ways. Discussed with patient at great length. We discussed different exercises and patient work with Event organiser. Started on gabapentin once weekly vitamin D from pain improvement. Discussed the importance of core strengthening instability of the long run. We discussed potential injections if patient continues to have trouble. Patient wants to try the conservative therapy first. We will also get labs with patient having significant difficult he with healing over the course of time. Depending on findings this may change medical management. Follow-up again in 3-4 weeks.

## 2016-06-18 ENCOUNTER — Other Ambulatory Visit: Payer: Self-pay | Admitting: Family Medicine

## 2016-06-18 ENCOUNTER — Telehealth: Payer: Self-pay | Admitting: Family Medicine

## 2016-06-18 DIAGNOSIS — M1A9XX0 Chronic gout, unspecified, without tophus (tophi): Secondary | ICD-10-CM

## 2016-06-18 DIAGNOSIS — M109 Gout, unspecified: Secondary | ICD-10-CM | POA: Insufficient documentation

## 2016-06-18 DIAGNOSIS — E119 Type 2 diabetes mellitus without complications: Secondary | ICD-10-CM

## 2016-06-18 LAB — CYCLIC CITRUL PEPTIDE ANTIBODY, IGG: Cyclic Citrullin Peptide Ab: <16 Units

## 2016-06-18 LAB — RHEUMATOID FACTOR: Rhuematoid fact SerPl-aCnc: <14 IU/mL (ref <14)

## 2016-06-18 LAB — ANA: Anti Nuclear Antibody(ANA): NEGATIVE

## 2016-06-18 LAB — CALCIUM, IONIZED: Calcium, Ion: 5.4 mg/dL (ref 4.8–5.6)

## 2016-06-18 MED ORDER — METFORMIN HCL 500 MG PO TABS: 500.0000 mg | ORAL_TABLET | Freq: Two times a day (BID) | ORAL | 1 refills | Status: DC

## 2016-06-18 MED ORDER — ALLOPURINOL 100 MG PO TABS: 200.0000 mg | ORAL_TABLET | Freq: Every day | ORAL | 3 refills | Status: DC

## 2016-06-18 NOTE — Telephone Encounter (Signed)
I received a note from Dr. Michiel SitesZ. Smith It appears that the patient has newly diagnosed diabetes, along with some other lab abnormalities I've actually not seen this patient since January 2017 Please contact patient; find out if Dr. Katrinka BlazingSmith is going to be managing his metformin, diabetes or will he [facility] us [facility]? We will want to see him if he's going to be seeing me If not, we wish him well but encourage him to f/u with Dr. Katrinka BlazingSmith if that's the plan or see another primary care provider to manage his diabetes Thank you

## 2016-06-18 NOTE — Telephone Encounter (Signed)
Called pt and he was so excited to hear from our office. He does wish to continue seeing Dr Sherie DonLada. He has an appt on 06/21/16 and is so pleased that we called him.

## 2016-06-19 IMAGING — RF DG ESOPHAGUS
11 series · 11 of 11 positions shown · non-contrast
Comparison: None in PACs

CLINICAL DATA: Chronic reflux symptoms, or more recent onset of
choking sensation, several years of intermittent episodes of food
feeling stuck.

EXAM:
ESOPHOGRAM / BARIUM SWALLOW / BARIUM TABLET STUDY
TECHNIQUE: Combined double contrast and single contrast examination performed
using effervescent crystals, thick barium liquid, and thin barium
liquid. The patient was observed with fluoroscopy swallowing a 13 mm
barium sulphate tablet.
FLUOROSCOPY TIME:  Fluoroscopy Time:  1 minutes, 18 seconds
Number of Acquired Images:  11

[Series 1: fluoro_barium 2fps_bw · 0.17mm/px · 1 of 1 slices shown (1 of 11)]
[im 1/1]
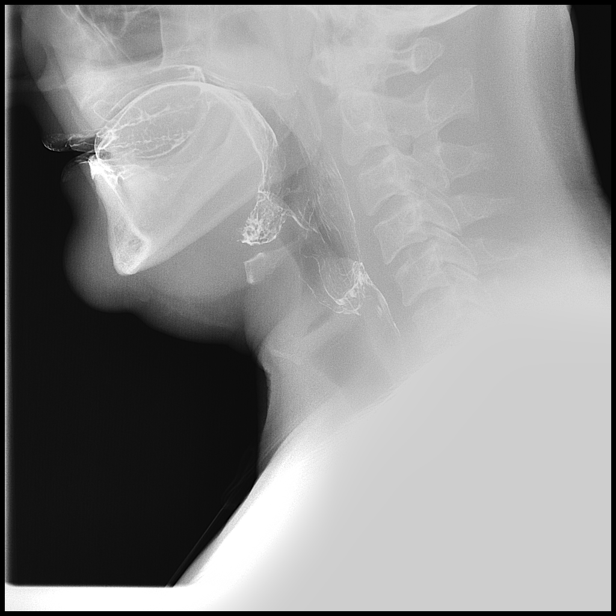

[Series 2: fluoro_barium 2fps_bw · 0.17mm/px · 1 of 1 slices shown (2 of 11)]
[im 1/1]
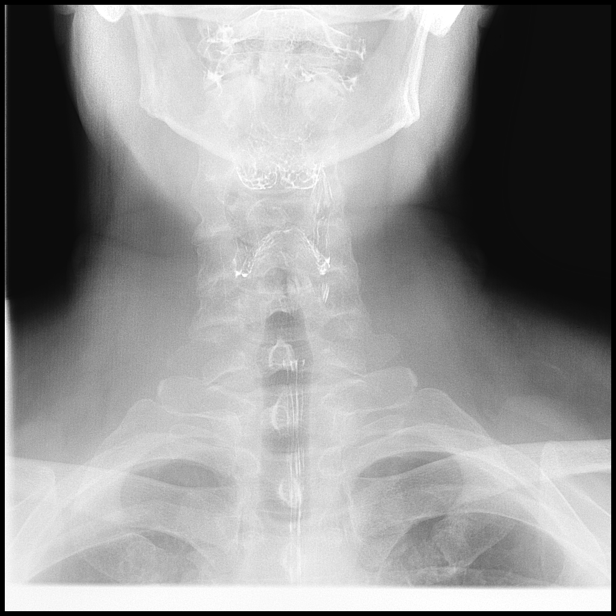

[Series 3: fluoro_barium 2fps_bw · 0.17mm/px · 1 of 1 slices shown (3 of 11)]
[im 1/1]
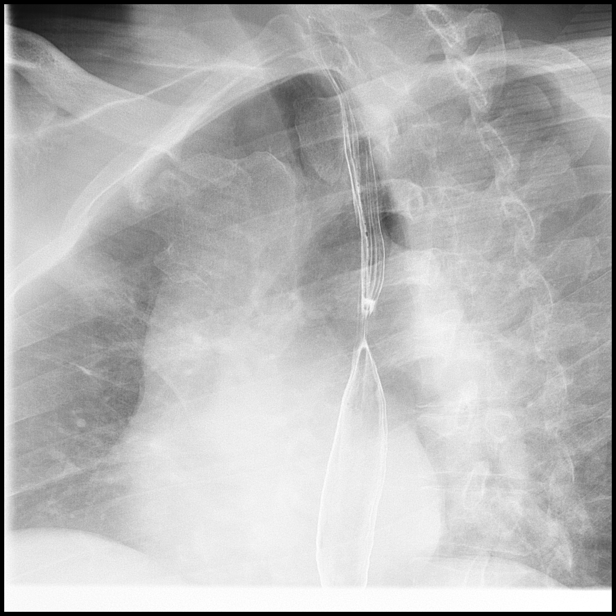

[Series 4: fluoro_barium 2fps_bw · 0.17mm/px · 1 of 1 slices shown (4 of 11)]
[im 1/1]
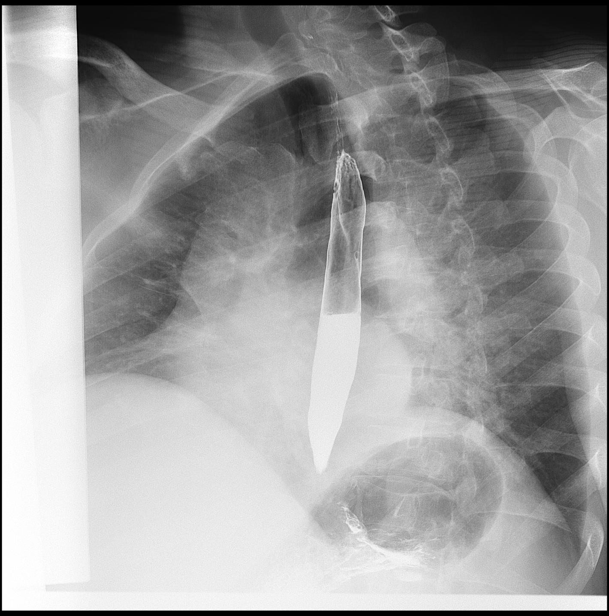

[Series 5: fluoro_barium 2fps_bw · 0.17mm/px · 1 of 1 slices shown (5 of 11)]
[im 1/1]
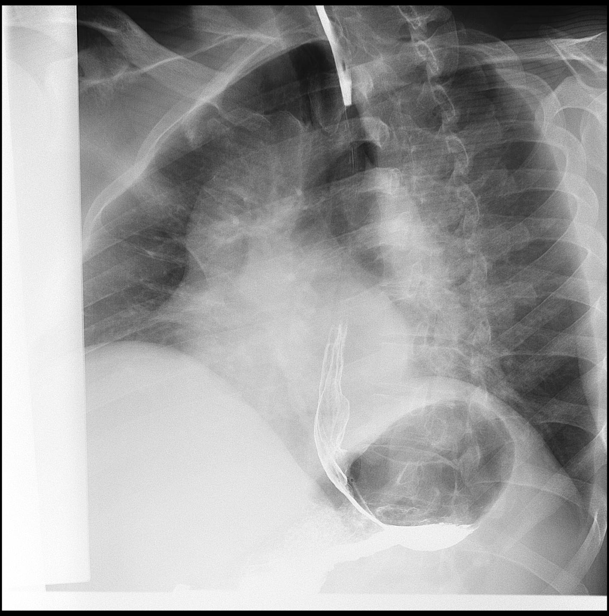

[Series 6: fluoro_barium 2fps_bw · 0.19mm/px · 1 of 1 slices shown (6 of 11)]
[im 1/1]
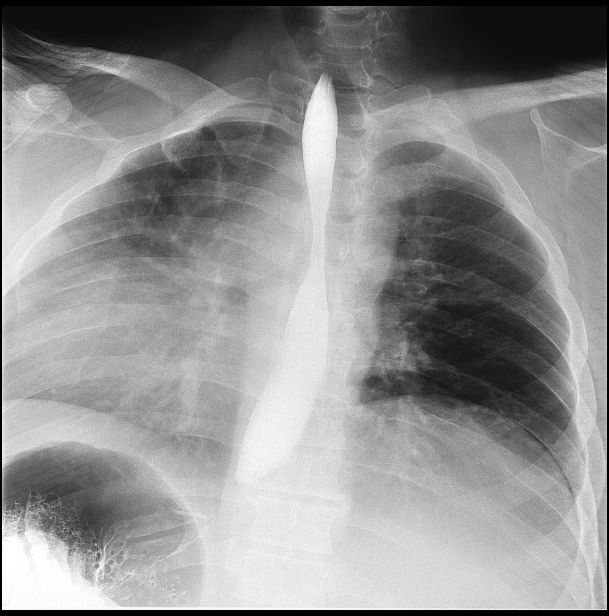

[Series 7: fluoro_barium 2fps_bw · 0.19mm/px · 1 of 1 slices shown (7 of 11)]
[im 1/1]
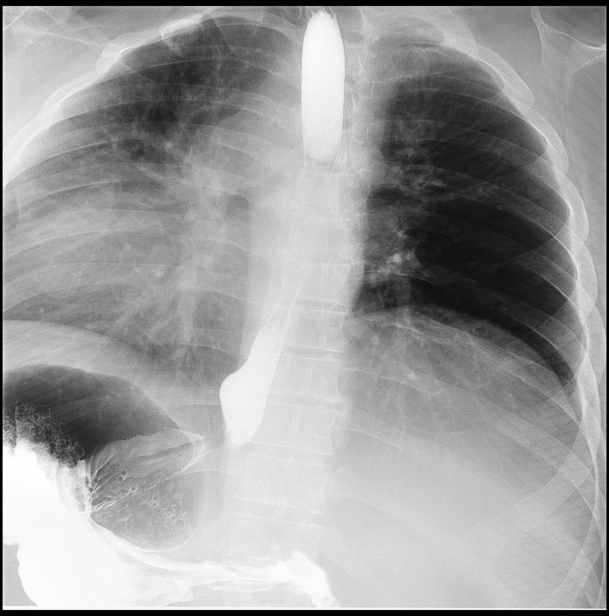

[Series 8: fluoro_barium 2fps_bw · 0.19mm/px · 1 of 1 slices shown (8 of 11)]
[im 1/1]
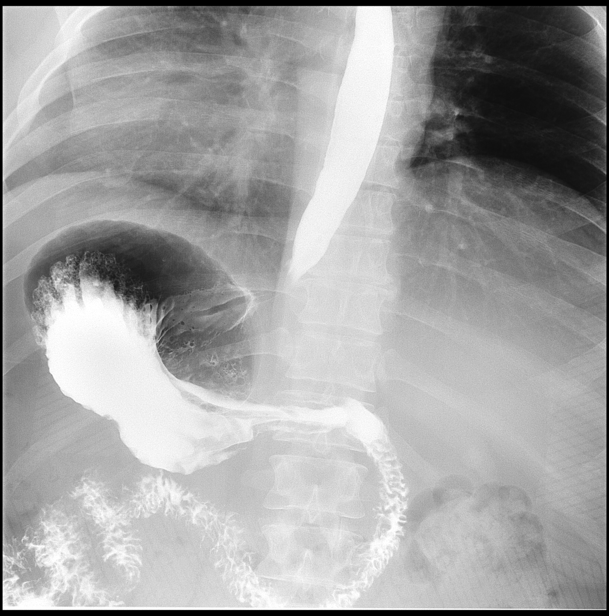

[Series 9: fluoro_barium 2fps_bw · 0.19mm/px · 1 of 1 slices shown (9 of 11)]
[im 1/1]
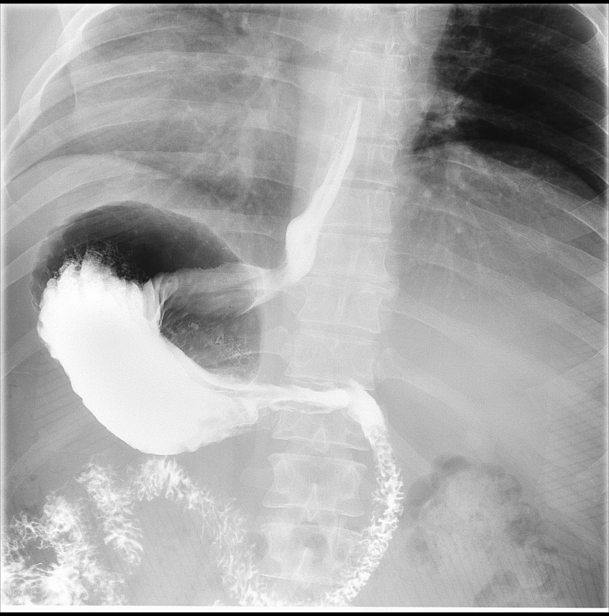

[Series 10: fluoro_barium 2fps_bw · 0.17mm/px · 1 of 1 slices shown (10 of 11)]
[im 1/1]
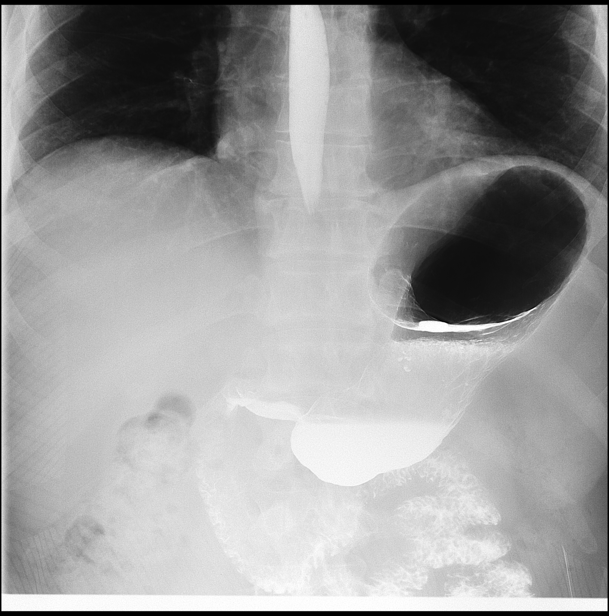

[Series 11: fluoro_barium 2fps_bw · 0.17mm/px · 1 of 1 slices shown (11 of 11)]
[im 1/1]
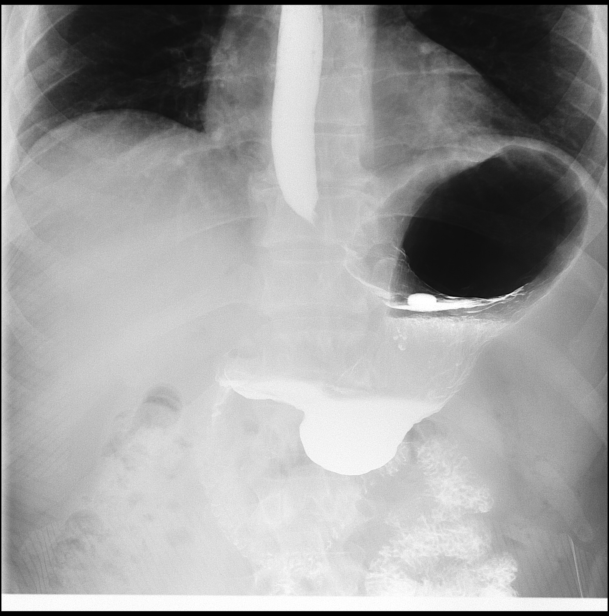

[11 of 11 positions shown; findings below may reference images not displayed]

FINDINGS: The patient ingested the thick and thin barium and the gas-forming
crystals without difficulty. The cervical esophagus distended well.
There was no laryngeal penetration of the barium. The thoracic
esophagus distended reasonably well. A small reducible hiatal hernia
was observed. No reflux was demonstrated. The barium tablet hung for
approximately 2 minutes in the distal esophagus but then passed into
the stomach. No annular constricting lesions were observed. No
definite mucosal changes were observed to suggest esophagitis.
IMPRESSION: There is a small reducible hiatal hernia where the barium tablet
transiently hangs. It passes with additional sips of barium. There
is no fixed stricture and no objective evidence of esophagitis.

The patient reports upper endoscopy is planned. He was encouraged to
proceed with this upper endoscopy to allow direct visualization of
the mucosal surface and to assess the caliber of the distal
esophagus.

## 2016-06-21 ENCOUNTER — Encounter: Payer: Self-pay | Admitting: Family Medicine

## 2016-06-21 ENCOUNTER — Ambulatory Visit (INDEPENDENT_AMBULATORY_CARE_PROVIDER_SITE_OTHER): Payer: BLUE CROSS/BLUE SHIELD | Admitting: Family Medicine

## 2016-06-21 VITALS — BP 120/78 | HR 84 | Temp 97.3°F | Resp 14 | Wt 230.7 lb

## 2016-06-21 DIAGNOSIS — E6609 Other obesity due to excess calories: Secondary | ICD-10-CM

## 2016-06-21 DIAGNOSIS — E790 Hyperuricemia without signs of inflammatory arthritis and tophaceous disease: Secondary | ICD-10-CM | POA: Diagnosis not present

## 2016-06-21 DIAGNOSIS — Z6830 Body mass index (BMI) 30.0-30.9, adult: Secondary | ICD-10-CM

## 2016-06-21 DIAGNOSIS — R739 Hyperglycemia, unspecified: Secondary | ICD-10-CM | POA: Diagnosis not present

## 2016-06-21 DIAGNOSIS — E66811 Obesity, class 1: Secondary | ICD-10-CM

## 2016-06-21 LAB — LIPID PANEL
Cholesterol: 213 mg/dL — ABNORMAL HIGH (ref <200)
HDL: 36 mg/dL — ABNORMAL LOW (ref >40)
LDL Cholesterol: 98 mg/dL (ref <100)
Total CHOL/HDL Ratio: 5.9 Ratio — ABNORMAL HIGH (ref <5.0)
Triglycerides: 394 mg/dL — ABNORMAL HIGH (ref <150)
VLDL: 79 mg/dL — ABNORMAL HIGH (ref <30)

## 2016-06-21 NOTE — Progress Notes (Signed)
BP 120/78   Pulse 84   Temp 97.3 F (36.3 C) (Oral)   Resp 14   Wt 230 lb 11.2 oz (104.6 kg)   SpO2 96%   BMI 30.44 kg/m    Subjective:    Patient ID: Travis Dorsey, male    DOB: 21-Jun-1970, 46 y.o.   MRN: [id number]  HPI: Travis Dorsey is a 46 y.o. male  Chief Complaint  Patient presents with  . Follow-up    was told he had diabetes by ortho   He went to see Dr. Hulan Saas in [place] with back; a previous doctor told him he'd need a big surgery Doctor did labs Called him the next morning; iron was great but he told him he has diabetes and gout High glucose 7 years ago; episode of not breathing, had to call 911, met them at the hospital; on prednisone Glucose was 112, fasting; A1c was 7.7 He does a lot of sweet tea He does not want to take medicine Would love to do this without medicine Maternal grandmother had diabetes; others did not Always thirsty, dry mouth, headaches No nocturia Left knee will hurt, but no redness or swelling; little pain in right elbow; rests it when driving; he had an elevated uric acid level on labs done through ortho [place] off a horse 3 years ago, skinned up right elbow and left knee real bad  Depression screen [facility] 2/9 06/21/2016 03/01/2015  Decreased Interest 0 0  Down, Depressed, Hopeless 0 0  PHQ - 2 Score 0 0   Relevant past medical, surgical, family and social history reviewed Past Medical History:  Diagnosis Date  . Strep throat    Past Surgical History:  Procedure Laterality Date  . BACK SURGERY    . COLONOSCOPY  04/06/15  . ESOPHAGOGASTRODUODENOSCOPY  04/06/15  . TONSILLECTOMY     Family History  Problem Relation Age of Onset  . Diabetes Maternal Grandmother   . Stroke Maternal Grandmother   . Emphysema Paternal Grandfather   . Cancer Neg Hx   . COPD Neg Hx   . Heart disease Neg Hx   . Hypertension Neg Hx    Social History  Substance Use Topics  . Smoking status: Former Smoker    Quit date:  04/04/2003  . Smokeless tobacco: Current User    Types: Chew  . Alcohol use No   Interim medical history since last visit reviewed. Allergies and medications reviewed  Review of Systems Per HPI unless specifically indicated above     Objective:    BP 120/78   Pulse 84   Temp 97.3 F (36.3 C) (Oral)   Resp 14   Wt 230 lb 11.2 oz (104.6 kg)   SpO2 96%   BMI 30.44 kg/m   Wt Readings from Last 3 Encounters:  06/21/16 230 lb 11.2 oz (104.6 kg)  06/17/16 237 lb 9.6 oz (107.8 kg)  06/23/15 238 lb 9.6 oz (108.2 kg)    Physical Exam  Constitutional: He appears well-developed and well-nourished. No distress.  HENT:  Head: Normocephalic and atraumatic.  Eyes: EOM are normal. No scleral icterus.  Neck: No thyromegaly present.  Cardiovascular: Normal rate and regular rhythm.   Pulmonary/Chest: Effort normal and breath sounds normal.  Abdominal: Soft. Bowel sounds are normal. He exhibits no distension.  Musculoskeletal: He exhibits no edema.  Neurological: Coordination normal.  Skin: Skin is warm and dry. No pallor.  Psychiatric: He has a normal mood and affect. His behavior  is normal. Judgment and thought content normal.      Assessment & Plan:   Problem List Items Addressed This Visit      Other   Obesity    Discussed role of excess weight and diabetes; urged him to work on safe weight loss, see AVS      Elevated blood uric acid level    May be related to obesity, diabetes (if indeed the case); he does not describe anything that sounds like gout flares; work on weight loss       Other Visit Diagnoses    Hyperglycemia    -  Primary   patient has one lab value in the diabetes range; will check another to have 2 lab values to confirm; he does not want medicine if he does have DM   Relevant Orders   Hemoglobin A1c (Completed)   Lipid panel (Completed)      Follow up plan: Return in about 3 months (around 09/21/2016) for follow-up.  An after-visit summary was printed  and given to the patient at [place].  Please see the patient instructions which may contain other information and recommendations beyond what is mentioned above in the assessment and plan.  Meds ordered this encounter  Medications  . Misc Natural Products (TART CHERRY ADVANCED PO)    Sig: Take by mouth.  . Turmeric 500 MG TABS    Sig: Take by mouth.    Orders Placed This Encounter  Procedures  . Hemoglobin A1c  . Lipid panel   Face-to-face time with patient was more than 25 minutes, >50% time spent counseling and coordination of care

## 2016-06-21 NOTE — Patient Instructions (Addendum)
Switch to unsweetened drinks If you need electrolyte replacement, consider Propel Avoid white bread, white rice, white pasta, biscuits, potatoes, french fries Try more vegetables Try cinnamon Oatmeal with cinnamon is a good breakfast Check out the information at familydoctor.org entitled "Nutrition for Weight Loss: What You Need to Know about Fad Diets" Try to lose between 1-2 pounds per week by taking in fewer calories and burning off more calories You can succeed by limiting portions, limiting foods dense in calories and fat, becoming more active, and drinking 8 glasses of water a day (64 ounces) Don't skip meals, especially breakfast, as skipping meals may alter your metabolism Do not use over-the-counter weight loss pills or gimmicks that claim rapid weight loss A healthy BMI (or body mass index) is between 18.5 and 24.9 You can calculate your ideal BMI at the NIH website [employer]hu   Uric Acid Test Why am I having this test? Uric acid is a chemical that results when certain substances in your body (purines) break down. You have purines in all your cells. When cells die, they release purines into your blood. You can also get purines from your diet. They are found in foods such as liver, mackerel, beans, and beer. Uric acid is mostly removed from your body by your kidneys. If your body is making too much uric acid, or if your kidneys are not removing it, a crystal form of uric acid can build up in your joints or kidneys. Uric acid crystals in your joints can cause a type of arthritis (gout). Crystals in your urine can form kidney stones. What kind of sample is taken? Uric acid can be measured in blood and urine, so you may have either a blood test or a urine test.  Blood test. This test requires a blood sample taken from a vein in your hand or arm. You may have this test if you have joint pain that may be due to gout. You may also have  this test if you are getting a type of cancer treatment that increases cell death and purines. This can lead to high uric acid levels.  Urine test. This test requires a sample of urine. You may need to collect all the urine you produce over a 24-hour period (24-hour urine sample). You may have the urine test if you have kidney stones. Finding out how much uric acid you are passing in your urine can help your health care provider determine the cause of your kidney stones. How do I prepare for this test?  You may not be able to eat for 4 hours before the blood test or as directed by your health care provider.  Let your health care provider know if:  You have recently exercised a lot. Heavy exercise can increase uric acid levels.  You are taking any medicines. Some medicines can also affect uric acid. What do the results mean? Your results will be compared with a reference range of values for this test. The reference ranges for the blood test are as follows:  Adult:  Male: 4.0-8.5 mg/dL or 1.61-0.96 mmol/L.  Male: 2.7-7.3 mg/dL or 0.45-4.09 mmol/L.  Child: 2.5-5.5 mg/dL or 8.11-9.14 mmol/L.  Newborn: 2.0-6.2 mg/dL. The reference range for the urine test is 250-750 mg per 24 hours or 1.48-4.43 mmol per day (SI units). Abnormally high levels of uric acid may mean:  Your body is making too much uric acid.  Your kidneys are not removing enough uric acid. You may need to have other tests.  Many things can cause a high level of uric acid. Common causes include:  Gout.  Kidney disease.  Cancer or cancer treatment.  A diet high in purines.  Alcohol abuse.  Diabetes. Certain medicines can cause low levels of uric acid, but this is usually not a cause for concern. Reference rangesmay vary among different labs and hospitals. Talk to your health care provider to discuss your results, treatment options, and if necessary, the need for more tests. It is your responsibility to obtain  your test results. Ask the lab or department performing the test when and how you will get your results. Talk with your health care provider if you have any questions about your results. Talk with your health care provider to discuss your results, treatment options, and if necessary, the need for more tests. Talk with your health care provider if you have any questions about your results. This information is not intended to replace advice given to you by your health care provider. Make sure you discuss any questions you have with your health care provider. Document Released: 04/12/2004 Document Revised: 11/22/2015 Document Reviewed: 07/12/2013 Elsevier Interactive Patient Education  2017 Elsevier Inc. Prediabetes Eating Plan Prediabetes-also called impaired glucose tolerance or impaired fasting glucose-is a condition that causes blood sugar (blood glucose) levels to be higher than normal. Following a healthy diet can help to keep prediabetes under control. It can also help to lower the risk of type 2 diabetes and heart disease, which are increased in people who have prediabetes. Along with regular exercise, a healthy diet:  Promotes weight loss.  Helps to control blood sugar levels.  Helps to improve the way that the body uses insulin. What do I need to know about this eating plan?  Use the glycemic index (GI) to plan your meals. The index tells you how quickly a food will raise your blood sugar. Choose low-GI foods. These foods take a longer time to raise blood sugar.  Pay close attention to the amount of carbohydrates in the food that you eat. Carbohydrates increase blood sugar levels.  Keep track of how many calories you take in. Eating the right amount of calories will help you to achieve a healthy weight. Losing about 7 percent of your starting weight can help to prevent type 2 diabetes.  You may want to follow a Mediterranean diet. This diet includes a lot of vegetables, lean meats or  fish, whole grains, fruits, and healthy oils and fats. What foods can I eat? Grains  Whole grains, such as whole-wheat or whole-grain breads, crackers, cereals, and pasta. Unsweetened oatmeal. Bulgur. Barley. Quinoa. Brown rice. Corn or whole-wheat flour tortillas or taco shells. Vegetables  Lettuce. Spinach. Peas. Beets. Cauliflower. Cabbage. Broccoli. Carrots. Tomatoes. Squash. Eggplant. Herbs. Peppers. Onions. Cucumbers. Brussels sprouts. Fruits  Berries. Bananas. Apples. Oranges. Grapes. Papaya. Mango. Pomegranate. Kiwi. Grapefruit. Cherries. Meats and Other Protein Sources  Seafood. Lean meats, such as chicken and [place] or lean cuts of pork and beef. Tofu. Eggs. Nuts. Beans. Dairy  Low-fat or fat-free dairy products, such as yogurt, cottage cheese, and cheese. Beverages  Water. Tea. Coffee. Sugar-free or diet soda. Seltzer water. Milk. Milk alternatives, such as soy or almond milk. Condiments  Mustard. Relish. Low-fat, low-sugar ketchup. Low-fat, low-sugar barbecue sauce. Low-fat or fat-free mayonnaise. Sweets and Desserts  Sugar-free or low-fat pudding. Sugar-free or low-fat ice cream and other frozen treats. Fats and Oils  Avocado. Walnuts. Olive oil. The items listed above may not be a complete list of recommended  foods or beverages. Contact your dietitian for more options.  What foods are not recommended? Grains  Refined white flour and flour products, such as bread, pasta, snack foods, and cereals. Beverages  Sweetened drinks, such as sweet iced tea and soda. Sweets and Desserts  Baked goods, such as cake, cupcakes, pastries, cookies, and cheesecake. The items listed above may not be a complete list of foods and beverages to avoid. Contact your dietitian for more information.  This information is not intended to replace advice given to you by your health care provider. Make sure you discuss any questions you have with your health care provider. Document Released: 08/02/2014  Document Revised: 08/24/2015 Document Reviewed: 04/13/2014 Elsevier Interactive Patient Education  2017 [employer]

## 2016-06-22 LAB — HEMOGLOBIN A1C
Hgb A1c MFr Bld: 7.3 % — ABNORMAL HIGH (ref <5.7)
Mean Plasma Glucose: 163 mg/dL

## 2016-06-24 DIAGNOSIS — E790 Hyperuricemia without signs of inflammatory arthritis and tophaceous disease: Secondary | ICD-10-CM | POA: Insufficient documentation

## 2016-06-24 NOTE — Assessment & Plan Note (Signed)
Discussed role of excess weight and diabetes; urged him to work on safe weight loss, see AVS

## 2016-06-24 NOTE — Assessment & Plan Note (Signed)
May be related to obesity, diabetes (if indeed the case); he does not describe anything that sounds like gout flares; work on weight loss

## 2016-06-28 ENCOUNTER — Telehealth: Payer: Self-pay | Admitting: Family Medicine

## 2016-06-28 DIAGNOSIS — E1165 Type 2 diabetes mellitus with hyperglycemia: Secondary | ICD-10-CM

## 2016-06-28 DIAGNOSIS — E781 Pure hyperglyceridemia: Secondary | ICD-10-CM

## 2016-06-28 NOTE — Telephone Encounter (Signed)
I talked with patient about diagnosis; truly type 2 diabetes; he does not want medicine; will try diet and weight loss; refer to diabetic educator TG high; he had eaten chik-fil-a and sweet tea before blood draw; work on weight loss, healthy eating, start fish oil BID Recheck labs in 3 months

## 2016-07-05 ENCOUNTER — Encounter: Payer: Self-pay | Admitting: Dietician

## 2016-07-05 ENCOUNTER — Encounter: Payer: BLUE CROSS/BLUE SHIELD | Attending: Family Medicine | Admitting: Dietician

## 2016-07-05 VITALS — Ht 73.0 in | Wt 223.5 lb

## 2016-07-05 DIAGNOSIS — R739 Hyperglycemia, unspecified: Secondary | ICD-10-CM | POA: Diagnosis not present

## 2016-07-05 DIAGNOSIS — E663 Overweight: Secondary | ICD-10-CM

## 2016-07-05 NOTE — Patient Instructions (Signed)
   Stay under close doctor supervision with keto diet to make sure to avoid side effects.   When weaning off the keto diet, start by adding 1-2 small servings of healthy starches or fruits per day, and reduce fat portions. After 3-5 weeks, add another serving or 2 of healthy carbs and further reduce fat or protein portions.   Eventually aim for a balanced plate that is 4/5-WUJW of vegetables and fruits, 1/4 plate of lean protein, and 1/4 plate of healthy starch.

## 2016-07-05 NOTE — Progress Notes (Signed)
Medical Nutrition Therapy: Visit start time: 1050  end time: 1150 Assessment:  Diagnosis: diabetes Past medical history: back surgery 9 years ago per patient  Psychosocial issues/ stress concerns: none Preferred learning method:  . Hands-on   Current weight: 223.5lbs with steel-toed shoes (likely 217lbs) Height: 6'1" Medications, supplements: reconciled list in medical record  Progress and evaluation: Patient is currently following keto diet for rapid weight loss and fat burning in order to reverse elevated HbA1C which was 7.3% on 06/21/16. He is consulting with Dr. Barrie Dunker in [facility] who manages bariatric patients with keto diet. He reports feeling better since starting the diet last week and knows he is losing weight, although is avoiding measuring his weight loss for 1 month. He seeks help in determining best dietary course over time, but does want to follow keto diet for now.     Physical activity: walking 4 miles daily. Unable to run due to back pain  Dietary Intake:  Usual eating pattern includes 3 meals and 0-1 snacks per day. Dining out frequency: 8 meals per week.  Overall meals include only meats, eggs, cheese and fats. He is eating 2 cups of leafy greens (salad) daily, and 1 cup of cooked low-carb vegetables daily. He includes a dessert of sugar free jello with redi-whip topping after supper.  Beverages are 1 - 1 1/2 gallons water daily + one bottle Propel daily   Nutrition Care Education: Topics covered: diabetes, long-term weight control Basic nutrition: basic food groups, appropriate nutrient balance, appropriate meal and snack schedule Weight control: benefits of weight control, importance of permanent sustainable diet changes to maintain weight loss.  Diabetes:  goals for BGs, appropriate meal and snack schedule,  appropriate carb intake and balance, provided guidance for healthy nutrient balance long-term-- 40-60%CHO, 15-30%protein, 25-35% fat. Instructed on general meal  planning guidelines; role of exercise in BG control.    Nutritional Diagnosis:  [place]-2.2 Altered nutrition-related laboratory As related to hyperglycemia.  As evidenced by HbA1C of 7.3%. [place]-3.3 Overweight/obesity As related to history of excess calories.  As evidenced by BMI 29.5.  Intervention: Instruction as noted above.   Patient wants to continue with Keto diet for about 3 months, advised he do so with medical supervision.   Set goals to transition to more balanced diet gradually.    Patient will call after next MD appointment to report progress and schedule follow-up if needed.  Education Materials given:  . General diet guidelines for Diabetes . Grocery shopping map-brochure . Goals/ instructions  Learner/ who was taught:  . Patient   Level of understanding: Marland Kitchen Verbalizes/ demonstrates competency  Demonstrated degree of understanding via:   Teach back Learning barriers: . None  Willingness to learn/ readiness for change: . Eager, change in progress  Monitoring and Evaluation:  Dietary intake, exercise, BG control, and body weight      follow up: prn

## 2016-07-11 NOTE — Progress Notes (Signed)
[facility] [address] [place], [place] [address] Phone: [phone] Subjective:    I'm seeing this patient by the request  of:  Baruch Gouty, MD  CC: Low back pain  BJY:NWGNFAOZHY  Travis Dorsey is a 46 y.o. male coming in with complaint of low back pain. Patient has had this for multiple years. Patient has had a laminectomy back in 2010. Patient has hand still pain for quite some time. Patient has had more mechanical symptoms for greater than 2 years with some radicular pain still occurring intermittently. Patient has seen other providers for this. Patient has been using chiropractic care with mild benefit. States that certain movements because radiation down the legs. Patient was seen by neurosurgery and they did discuss the possibility of a lumbar interbody fusion. Patient states After we diagnosed him with diabetes he is changes diet significant healing. Patient has made significant improvement. Has lost 17 and 19 pounds. Patient is feeling much better. Back pain is very minimal. Very happy with the results. Taking the over-the-counter medications which is made significant improvement as well.   last imaging that I can see is a CT scan of the lumbar spine November 2010. Patient did have postsurgical changes at L5-S1 but still some mild L5 and S1 nerve root impingement noted.  Patient initially was brought a new MRI with him today. Did show that patient If lower, then increase the intensity of your exercise. If the number is higher, you may decrease the intensity of your exercise. At L5-S1 with broad-based bulge that could be causing potentially right S1 nerve root impingement.  Past Medical History:  Diagnosis Date  . Strep throat    Past Surgical History:  Procedure Laterality Date  . BACK SURGERY    . COLONOSCOPY  04/06/15  . ESOPHAGOGASTRODUODENOSCOPY  04/06/15  . TONSILLECTOMY     Social History   Social History  . Marital status:  Married    Spouse name: N/A  . Number of children: N/A  . Years of education: N/A   Social History Main Topics  . Smoking status: Former Smoker    Quit date: 04/04/2003  . Smokeless tobacco: Current User    Types: Chew  . Alcohol use No  . Drug use: No  . Sexual activity: Not Asked   Other Topics Concern  . None   Social History Narrative  . None   Allergies  Allergen Reactions  . Penicillins Rash   Family History  Problem Relation Age of Onset  . Diabetes Maternal Grandmother   . Stroke Maternal Grandmother   . Emphysema Paternal Grandfather   . Cancer Neg Hx   . COPD Neg Hx   . Heart disease Neg Hx   . Hypertension Neg Hx     Past medical history, social, surgical and family history all reviewed in electronic medical record.  No pertanent information unless stated regarding to the chief complaint.   Review of Systems: No headache, visual changes, nausea, vomiting, diarrhea, constipation, dizziness, abdominal pain, skin rash, fevers, chills, night sweats, weight loss, swollen lymph nodes, body aches, joint swelling, muscle aches, chest pain, shortness of breath, mood changes.    Objective  Blood pressure 136/80, pulse 86, resp. rate 16, weight 213 lb (96.6 kg), SpO2 95 %.   Systems examined below as of 07/12/16 General: NAD A&O x3 mood, affect normal  HEENT: Pupils equal, extraocular movements intact no nystagmus Respiratory: not short of breath at rest or with speaking  Cardiovascular: No lower extremity edema, non tender Skin: Warm dry intact with no signs of infection or rash on extremities or on axial skeleton. Abdomen: Soft nontender, no masses Neuro: Cranial nerves  intact, neurovascularly intact in all extremities with 2+ DTRs and 2+ pulses. Lymph: No lymphadenopathy appreciated today  Gait normal with good balance and coordination.  MSK: Non tender with full range of motion and good stability and symmetric strength and tone of shoulders, elbows, wrist,  knee  hips and ankles bilaterally.   Back Exam:  Inspection: Unremarkable  Motion: Flexion 45 deg, Extension 35 deg, Side Bending to 45 deg bilaterally,  Rotation to 45 deg bilaterally  SLR laying: Negative  XSLR laying: Negative  Palpable tenderness: None. FABER: negative. Sensory change: Gross sensation intact to all lumbar and sacral dermatomes.  Reflexes: 2+ at both patellar tendons, 2+ at achilles tendons, Babinski's downgoing.  Strength at foot  Plantar-flexion: 5/5 Dorsi-flexion: 5/5 Eversion: 5/5 Inversion: 5/5  Leg strength  Quad: 5/5 Hamstring: 5/5 Hip flexor: 5/5 Hip abductors: 5/5  Gait unremarkable.     Impression and Recommendations:     This case required medical decision making of moderate complexity.      Note: This dictation was prepared with Dragon dictation along with smaller phrase technology. Any transcriptional errors that result from this process are unintentional.

## 2016-07-12 ENCOUNTER — Encounter: Payer: Self-pay | Admitting: Family Medicine

## 2016-07-12 ENCOUNTER — Ambulatory Visit (INDEPENDENT_AMBULATORY_CARE_PROVIDER_SITE_OTHER): Payer: BLUE CROSS/BLUE SHIELD | Admitting: Family Medicine

## 2016-07-12 DIAGNOSIS — E1165 Type 2 diabetes mellitus with hyperglycemia: Secondary | ICD-10-CM | POA: Diagnosis not present

## 2016-07-12 DIAGNOSIS — M1A9XX0 Chronic gout, unspecified, without tophus (tophi): Secondary | ICD-10-CM | POA: Diagnosis not present

## 2016-07-12 DIAGNOSIS — M5416 Radiculopathy, lumbar region: Secondary | ICD-10-CM | POA: Diagnosis not present

## 2016-07-12 NOTE — Patient Instructions (Signed)
Good to see you  I am proud of you! Travis Dorsey doing great  Continue the turmeric daily for another 4 weeks then go to as needed Continue the vitamin D and the tart cherry  Keep it up!!!! \See me again in 6-8 weeks.

## 2016-07-12 NOTE — Assessment & Plan Note (Signed)
Diet controlled. Making significant improvement at this time. Impressed.

## 2016-07-12 NOTE — Assessment & Plan Note (Signed)
Significant improvement with no radicular symptoms. Patient was given an exercise prescription to increase activity. We discussed icing regimen and home exercises. Discussed continuing the over-the-counter medications as well as once weekly vitamin D at this time. Follow-up again in 6-8 weeks.

## 2016-07-12 NOTE — Progress Notes (Signed)
Pre-visit discussion using our clinic review tool. No additional management support is needed unless otherwise documented below in the visit note.  

## 2016-07-12 NOTE — Assessment & Plan Note (Signed)
Stable, diet controlled  

## 2016-07-20 ENCOUNTER — Other Ambulatory Visit: Payer: Self-pay | Admitting: Family Medicine

## 2016-07-26 ENCOUNTER — Telehealth: Payer: Self-pay | Admitting: Family Medicine

## 2016-07-26 DIAGNOSIS — E1165 Type 2 diabetes mellitus with hyperglycemia: Secondary | ICD-10-CM

## 2016-07-26 DIAGNOSIS — R7989 Other specified abnormal findings of blood chemistry: Secondary | ICD-10-CM

## 2016-07-26 DIAGNOSIS — E790 Hyperuricemia without signs of inflammatory arthritis and tophaceous disease: Secondary | ICD-10-CM

## 2016-07-26 DIAGNOSIS — E781 Pure hyperglyceridemia: Secondary | ICD-10-CM

## 2016-07-26 NOTE — Telephone Encounter (Signed)
Thank you I've entered the future orders However, we need to push his appointment back a little The A1c has to be done at least 3 months after the last one Please have him get his labs done on or after June 25th, and push his appointment with me back to June 27th or later so we'll have results back Thank you

## 2016-07-26 NOTE — Telephone Encounter (Signed)
PT IS ASKING FOR BLOOD WORK ORDERS BEFORE HIS APPT IN June. HE SAYS THAT YOU HAD REQUESTED THAT HE HAVE BLOOD WORK DONE AND HE WANTS TO BE ABLE TO TALK WITH YOU ON THE DAY OF HIS APPT ABOUT THE RESULTS OF HIS BLOOD WORK. SAYS THAT HE KNOWS THAT YOU WILL BE ON VACATION THAT MONTH AND HE WILL BE ALSO AND DOES NOT WANT TO WAIT ANY LONGER THAN HE HAS TO TO GET RESULTS OF THE BLOOD WORK.

## 2016-07-26 NOTE — Telephone Encounter (Signed)
PT IS COMING IN ON jULY 3RD FOR YOU ARE OUT OF THE OFFICE ON jUNE 28TH

## 2016-08-29 NOTE — Progress Notes (Signed)
[name] [address] [place], [place] [address] Phone: [phone] Subjective:    I'm seeing this patient by the request  of:  Lada, Janit BernMelinda P, MD  CC: Low back pain  BJY:NWGNFAOZHYHPI:Subjective  Travis Dorsey is a 46 y.o. male coming in with complaint of low back pain. Patient has had this for multiple years. Patient has had a laminectomy back in 2010. Patient has hand still pain for quite some time. Patient has had more mechanical symptoms for greater than 2 years with some radicular pain still occurring intermittently. Patient has seen other providers for this. Patient has been using chiropractic care with mild benefit. States that certain movements because radiation down the legs. Patient was seen by neurosurgery and they did discuss the possibility of a lumbar interbody fusion. Patient states After we diagnosed him with diabetes he is changes diet significant healing. Patient has made significant improvement. Has lost 17 and 19 pounds. Patient is feeling much better. Back pain is very minimal. Very happy with the results. Taking the over-the-counter medications which is made significant improvement as well.   last imaging that I can see is a CT scan of the lumbar spine November 2010. Patient did have postsurgical changes at L5-S1 but still some mild L5 and S1 nerve root impingement noted.  Patient initially was brought a new MRI with him today. Did show that patient If lower, then increase the intensity of your exercise. If the number is higher, you may decrease the intensity of your exercise. At L5-S1 with broad-based bulge that could be causing potentially right S1 nerve root impingement.  Past Medical History:  Diagnosis Date  . Strep throat    Past Surgical History:  Procedure Laterality Date  . BACK SURGERY    . COLONOSCOPY  04/06/15  . ESOPHAGOGASTRODUODENOSCOPY  04/06/15  . TONSILLECTOMY     Social History   Social History  . Marital status:  Married    Spouse name: N/A  . Number of children: N/A  . Years of education: N/A   Social History Main Topics  . Smoking status: Former Smoker    Quit date: 04/04/2003  . Smokeless tobacco: Current User    Types: Chew  . Alcohol use No  . Drug use: No  . Sexual activity: Not Asked   Other Topics Concern  . None   Social History Narrative  . None   Allergies  Allergen Reactions  . Penicillins Rash   Family History  Problem Relation Age of Onset  . Diabetes Maternal Grandmother   . Stroke Maternal Grandmother   . Emphysema Paternal Grandfather   . Cancer Neg Hx   . COPD Neg Hx   . Heart disease Neg Hx   . Hypertension Neg Hx     Past medical history, social, surgical and family history all reviewed in electronic medical record.  No pertanent information unless stated regarding to the chief complaint.   Review of Systems: No headache, visual changes, nausea, vomiting, diarrhea, constipation, dizziness, abdominal pain, skin rash, fevers, chills, night sweats, weight loss, swollen lymph nodes, body aches, joint swelling, muscle aches, chest pain, shortness of breath, mood changes.    Objective  Blood pressure 110/82, pulse 66, weight 196 lb (88.9 kg).   Systems examined below as of 08/30/16 General: NAD A&O x3 mood, affect normal  HEENT: Pupils equal, extraocular movements intact no nystagmus Respiratory: not short of breath at rest or with speaking Cardiovascular: No lower extremity edema,  non tender Skin: Warm dry intact with no signs of infection or rash on extremities or on axial skeleton. Abdomen: Soft nontender, no masses Neuro: Cranial nerves  intact, neurovascularly intact in all extremities with 2+ DTRs and 2+ pulses. Lymph: No lymphadenopathy appreciated today  Gait normal with good balance and coordination.  MSK: Non tender with full range of motion and good stability and symmetric strength and tone of shoulders, elbows, wrist,  knee hips and ankles  bilaterally.   Back Exam:  Inspection: Mild loss of lordosis. Patient does have the incision from previous surgery Motion: Flexion 40 deg, Extension 35 deg, Side Bending to 45 deg bilaterally,  Rotation to 45 deg bilaterally  SLR laying: Negative  XSLR laying: Negative  Palpable tenderness: Tender to palpation in the paraspinal musculature of the lumbar spine left greater than right.Marland Kitchen FABER: negative. Sensory change: Gross sensation intact to all lumbar and sacral dermatomes.  Reflexes: 2+ at both patellar tendons, 2+ at achilles tendons, Babinski's downgoing.  Strength at foot  Plantar-flexion: 5/5 Dorsi-flexion: 5/5 Eversion: 5/5 Inversion: 5/5  Leg strength  Quad: 5/5 Hamstring: 5/5 Hip flexor: 5/5 Hip abductors: 5/5  Gait unremarkable.  Osteopathic findings C2 flexed rotated and side bent right C4 flexed rotated and side bent left C6 flexed rotated and side bent left T3 extended rotated and side bent right inhaled third rib T9 extended rotated and side bent left L1 flexed rotated and side bent left L5 flexed rotated and side bent right Sacrum right on right    Impression and Recommendations:     This case required medical decision making of moderate complexity.      Note: This dictation was prepared with Dragon dictation along with smaller phrase technology. Any transcriptional errors that result from this process are unintentional.

## 2016-08-30 ENCOUNTER — Other Ambulatory Visit: Payer: Self-pay

## 2016-08-30 ENCOUNTER — Ambulatory Visit (INDEPENDENT_AMBULATORY_CARE_PROVIDER_SITE_OTHER): Payer: BLUE CROSS/BLUE SHIELD | Admitting: Family Medicine

## 2016-08-30 ENCOUNTER — Encounter: Payer: Self-pay | Admitting: Family Medicine

## 2016-08-30 DIAGNOSIS — M999 Biomechanical lesion, unspecified: Secondary | ICD-10-CM | POA: Insufficient documentation

## 2016-08-30 DIAGNOSIS — M545 Low back pain, unspecified: Secondary | ICD-10-CM

## 2016-08-30 DIAGNOSIS — M5416 Radiculopathy, lumbar region: Secondary | ICD-10-CM

## 2016-08-30 MED ORDER — MONTELUKAST SODIUM 10 MG PO TABS: 10.0000 mg | ORAL_TABLET | Freq: Every day | ORAL | 3 refills | Status: AC

## 2016-08-30 NOTE — Assessment & Plan Note (Signed)
Decision today to treat with OMT was based on Physical Exam  After verbal consent patient was treated with HVLA, ME, FPR techniques in cervical, thoracic, lumbar and sacral areas  Patient tolerated the procedure well with improvement in symptoms  Patient given exercises, stretches and lifestyle modifications  See medications in patient instructions if given  Patient will follow up in 6 weeks 

## 2016-08-30 NOTE — Assessment & Plan Note (Signed)
Patient is having intermittent lumbar radiculopathy still. Patient seems to be doing relatively well with the gabapentin when she is only taking intermittently. Encourage him to take once weekly vitamin D. We discussed possible Singulair secondary to potential scar tissue and swelling. Patient's is going to start increasing activity and will be sent to formal physical therapy. Responding well to manipulation and we'll continue patient will follow-up again in 6 weeks

## 2016-08-30 NOTE — Patient Instructions (Signed)
Good to see you  Singulair nightly for next month Roseanne RenoStewart PT will call you  Stay active Keep doing our exercises  See me again in 4-6 weeks if the manipulation helps and to check in

## 2016-09-05 DIAGNOSIS — M5441 Lumbago with sciatica, right side: Secondary | ICD-10-CM | POA: Diagnosis not present

## 2016-09-05 DIAGNOSIS — M5442 Lumbago with sciatica, left side: Secondary | ICD-10-CM | POA: Diagnosis not present

## 2016-09-16 ENCOUNTER — Ambulatory Visit: Payer: BLUE CROSS/BLUE SHIELD | Admitting: Family Medicine

## 2016-09-23 ENCOUNTER — Other Ambulatory Visit: Payer: Self-pay

## 2016-09-23 DIAGNOSIS — E1165 Type 2 diabetes mellitus with hyperglycemia: Secondary | ICD-10-CM | POA: Diagnosis not present

## 2016-09-23 DIAGNOSIS — R7989 Other specified abnormal findings of blood chemistry: Secondary | ICD-10-CM | POA: Diagnosis not present

## 2016-09-23 DIAGNOSIS — E781 Pure hyperglyceridemia: Secondary | ICD-10-CM | POA: Diagnosis not present

## 2016-09-23 DIAGNOSIS — E790 Hyperuricemia without signs of inflammatory arthritis and tophaceous disease: Secondary | ICD-10-CM

## 2016-09-23 LAB — MICROALBUMIN / CREATININE URINE RATIO
Creatinine, Urine: 152 mg/dL (ref 20–370)
Microalb Creat Ratio: 2 mcg/mg creat (ref <30)
Microalb, Ur: 0.3 mg/dL

## 2016-09-24 LAB — LIPID PANEL
Cholesterol: 231 mg/dL — ABNORMAL HIGH (ref <200)
HDL: 52 mg/dL (ref >40)
LDL Cholesterol: 160 mg/dL — ABNORMAL HIGH (ref <100)
Total CHOL/HDL Ratio: 4.4 Ratio (ref <5.0)
Triglycerides: 95 mg/dL (ref <150)
VLDL: 19 mg/dL (ref <30)

## 2016-09-24 LAB — URIC ACID: Uric Acid, Serum: 6 mg/dL (ref 4.0–8.0)

## 2016-09-24 LAB — BASIC METABOLIC PANEL
BUN: 22 mg/dL (ref 7–25)
CO2: 23 mmol/L (ref 20–31)
Calcium: 9.9 mg/dL (ref 8.6–10.3)
Chloride: 106 mmol/L (ref 98–110)
Creat: 1.16 mg/dL (ref 0.60–1.35)
Glucose, Bld: 88 mg/dL (ref 65–99)
Potassium: 4.5 mmol/L (ref 3.5–5.3)
Sodium: 143 mmol/L (ref 135–146)

## 2016-09-24 LAB — HEMOGLOBIN A1C
Hgb A1c MFr Bld: 5.7 % — ABNORMAL HIGH (ref <5.7)
Mean Plasma Glucose: 117 mg/dL

## 2016-10-01 ENCOUNTER — Ambulatory Visit (INDEPENDENT_AMBULATORY_CARE_PROVIDER_SITE_OTHER): Payer: BLUE CROSS/BLUE SHIELD | Admitting: Family Medicine

## 2016-10-01 ENCOUNTER — Encounter: Payer: Self-pay | Admitting: Family Medicine

## 2016-10-01 DIAGNOSIS — M999 Biomechanical lesion, unspecified: Secondary | ICD-10-CM

## 2016-10-01 DIAGNOSIS — K219 Gastro-esophageal reflux disease without esophagitis: Secondary | ICD-10-CM | POA: Diagnosis not present

## 2016-10-01 DIAGNOSIS — E790 Hyperuricemia without signs of inflammatory arthritis and tophaceous disease: Secondary | ICD-10-CM | POA: Diagnosis not present

## 2016-10-01 DIAGNOSIS — E781 Pure hyperglyceridemia: Secondary | ICD-10-CM | POA: Diagnosis not present

## 2016-10-01 DIAGNOSIS — R7989 Other specified abnormal findings of blood chemistry: Secondary | ICD-10-CM

## 2016-10-01 DIAGNOSIS — M1A9XX0 Chronic gout, unspecified, without tophus (tophi): Secondary | ICD-10-CM

## 2016-10-01 DIAGNOSIS — E119 Type 2 diabetes mellitus without complications: Secondary | ICD-10-CM

## 2016-10-01 DIAGNOSIS — E7800 Pure hypercholesterolemia, unspecified: Secondary | ICD-10-CM | POA: Diagnosis not present

## 2016-10-01 NOTE — Assessment & Plan Note (Signed)
LDL rose from 98 to 160 on the keto diet; cautioned him about this; will have resume relatively normal diet, not high fat, and recheck in 3-4 months

## 2016-10-01 NOTE — Assessment & Plan Note (Signed)
Resolved after diet and weight loss

## 2016-10-01 NOTE — Progress Notes (Signed)
BP 122/80   Pulse 88   Temp 97.6 F (36.4 C) (Oral)   Resp 14   Wt 179 lb (81.2 kg)   SpO2 98%   BMI 23.62 kg/m    Subjective:    Patient ID: Travis Dorsey, male    DOB: April 10, 1970, 46 y.o.   MRN: [id number]  HPI: Travis Dorsey is a 46 y.o. male  Chief Complaint  Patient presents with  . Follow-up    3 month    HPI   Patient is here for f/u of type 2 diabetes He has really really been working on weight loss Significantly changed his diet; keto diet Last A1c was 7.3 in March, prior to that was 7.7 Consuming high fat meals, keto diet, 80/20 hamburger, butter, etc. Lost 10 pounds the first week; whole lemon every night before bed, drank the juice No urine output; did have some muscle aches Trouble with the back and sees specialist on Thursday Under the shoulder blade; going to see orthopaedist Acid reflux much better after being on this diet, no symptoms Taking singulair and fish oil No gout flares since last lab check; never had a flare  Lab Results  Component Value Date   HGBA1C 5.7 (H) 09/23/2016    Depression screen PHQ 2/9 10/01/2016 07/05/2016 06/21/2016 03/01/2015  Decreased Interest 0 0 0 0  Down, Depressed, Hopeless 0 0 0 0  PHQ - 2 Score 0 0 0 0    Relevant past medical, surgical, family and social history reviewed Past Medical History:  Diagnosis Date  . Strep throat    Past Surgical History:  Procedure Laterality Date  . BACK SURGERY    . COLONOSCOPY  04/06/15  . ESOPHAGOGASTRODUODENOSCOPY  04/06/15  . TONSILLECTOMY     Family History  Problem Relation Age of Onset  . Diabetes Maternal Grandmother   . Stroke Maternal Grandmother   . Dementia Maternal Grandmother   . Emphysema Paternal Grandfather   . Dementia Paternal Grandfather   . Heart disease Paternal Grandfather   . ALS Maternal Grandfather   . Dementia Paternal Grandmother   . Cancer Neg Hx   . COPD Neg Hx   . Hypertension Neg Hx    Social History   Social History    . Marital status: Married    Spouse name: N/A  . Number of children: N/A  . Years of education: N/A   Occupational History  . Not on file.   Social History Main Topics  . Smoking status: Former Smoker    Quit date: 04/04/2003  . Smokeless tobacco: Current User    Types: Chew  . Alcohol use No  . Drug use: No  . Sexual activity: Not on file   Other Topics Concern  . Not on file   Social History Narrative  . No narrative on file    Interim medical history since last visit reviewed. Allergies and medications reviewed  Review of Systems Per HPI unless specifically indicated above     Objective:    BP 122/80   Pulse 88   Temp 97.6 F (36.4 C) (Oral)   Resp 14   Wt 179 lb (81.2 kg)   SpO2 98%   BMI 23.62 kg/m   Wt Readings from Last 3 Encounters:  10/01/16 179 lb (81.2 kg)  08/30/16 196 lb (88.9 kg)  07/12/16 213 lb (96.6 kg)    Physical Exam  Constitutional: He appears well-developed and well-nourished. No distress.  Weight loss noted, acknowledged  HENT:  Head: Normocephalic and atraumatic.  Eyes: EOM are normal. No scleral icterus.  Neck: No thyromegaly present.  Cardiovascular: Normal rate and regular rhythm.   Pulmonary/Chest: Effort normal and breath sounds normal.  Abdominal: Soft. He exhibits no distension.  Musculoskeletal:       Right shoulder: He exhibits tenderness. He exhibits no swelling and no deformity.       Arms: Neurological: He is alert.  Skin: Skin is warm and dry. No pallor.  Psychiatric: He has a normal mood and affect.    Results for orders placed or performed in visit on 09/23/16  Lipid panel  Result Value Ref Range   Cholesterol 231 (H) <200 mg/dL   Triglycerides 95 <409 mg/dL   HDL 52 >81 mg/dL   Total CHOL/HDL Ratio 4.4 <5.0 Ratio   VLDL 19 <30 mg/dL   LDL Cholesterol 191 (H) <100 mg/dL  Hemoglobin Y7W  Result Value Ref Range   Hgb A1c MFr Bld 5.7 (H) <5.7 %   Mean Plasma Glucose 117 mg/dL  Uric acid  Result Value  Ref Range   Uric Acid, Serum 6.0 4.0 - 8.0 mg/dL  Microalbumin / creatinine urine ratio  Result Value Ref Range   Creatinine, Urine 152 20 - 370 mg/dL   Microalb, Ur 0.3 Not estab mg/dL   Microalb Creat Ratio 2 <30 mcg/mg creat  Basic Metabolic Panel (BMET)  Result Value Ref Range   Sodium 143 135 - 146 mmol/L   Potassium 4.5 3.5 - 5.3 mmol/L   Chloride 106 98 - 110 mmol/L   CO2 23 20 - 31 mmol/L   Glucose, Bld 88 65 - 99 mg/dL   BUN 22 7 - 25 mg/dL   Creat 2.95 6.21 - 3.08 mg/dL   Calcium 9.9 8.6 - 65.7 mg/dL      Assessment & Plan:   Problem List Items Addressed This Visit      Digestive   Esophageal reflux    Resolved after diet and weight loss        Endocrine   Diabetes (HCC)    A1c has really come down with weight loss; he declined diabetic education classes; recheck A1c in 6 months      Relevant Orders   Basic metabolic panel   Hemoglobin A1c     Other   Nonallopathic lesion of thoracic region    Going to see Dr. Katrinka Blazing on Thursday      Hypertriglyceridemia    Taking fish oil, but recent high fat diet; TG dropped almost 300 points, but at the expense of the LDL level; patient to resume relatively normal diet now; recheck in 3-4 months      Relevant Orders   Lipid panel   Gout    No flares, just elev uric acid; avoid purine-rich foods      Elevated serum creatinine    Recent creatinine showed return to normal range      Elevated low density lipoprotein (LDL) cholesterol level    LDL rose from 98 to 160 on the keto diet; cautioned him about this; will have resume relatively normal diet, not high fat, and recheck in 3-4 months      Relevant Orders   Lipid panel   Elevated blood uric acid level    Level had dropped from over 8 to 6; no attacks; will not recheck unless symptomatic; cautioned about high purine foods          Follow up plan: Return in about 4  months (around 02/01/2017) for twenty minute follow-up with fasting labs one week  prior.  An after-visit summary was printed and given to the patient at check-out.  Please see the patient instructions which may contain other information and recommendations beyond what is mentioned above in the assessment and plan.  Meds ordered this encounter  Medications  . Vitamin D, Ergocalciferol, (DRISDOL) 50000 units CAPS capsule    Sig: Take 50,000 Units by mouth once a week.     Refill:  0    Orders Placed This Encounter  Procedures  . Lipid panel  . Basic metabolic panel  . Hemoglobin A1c

## 2016-10-01 NOTE — Assessment & Plan Note (Addendum)
Level had dropped from over 8 to 6; no attacks; will not recheck unless symptomatic; cautioned about high purine foods

## 2016-10-01 NOTE — Patient Instructions (Signed)
Maintain weight Avoid sweets for the most part Recheck labs in 4 months

## 2016-10-01 NOTE — Assessment & Plan Note (Signed)
controlled 

## 2016-10-01 NOTE — Assessment & Plan Note (Addendum)
A1c has really come down with weight loss; he declined diabetic education classes; recheck A1c in 6 months

## 2016-10-01 NOTE — Assessment & Plan Note (Signed)
No flares, just elev uric acid; avoid purine-rich foods

## 2016-10-01 NOTE — Assessment & Plan Note (Addendum)
Recent creatinine showed return to normal range

## 2016-10-01 NOTE — Assessment & Plan Note (Addendum)
Taking fish oil, but recent high fat diet; TG dropped almost 300 points, but at the expense of the LDL level; patient to resume relatively normal diet now; recheck in 3-4 months

## 2016-10-01 NOTE — Assessment & Plan Note (Signed)
Going to see Dr. Katrinka BlazingSmith on Thursday

## 2016-10-02 NOTE — Progress Notes (Signed)
[facility] [address] [place], [place] [address] Phone: [phone] Subjective:    I'm seeing this patient by the request  of:  Lada, Janit Bern, MD  CC: Low back pain f/u  [id number]  Travis Dorsey is a 46 y.o. male coming in with complaint of low back pain. Patient has had this for multiple years. Patient has had a laminectomy back in 2010. Patient has hand still pain for quite some time. Patient has had more mechanical symptoms for greater than 2 years with some radicular pain still occurring intermittently.  Patient wasn't doing well with losing weight. Unfortunate patient states that now the back pain seems to have plateaued. Increasing radicular symptoms and seems to be doing to the buttocks area bilaterally. States that this can be accompanied with some muscle spasms.    last imaging that I can see is a CT scan of the lumbar spine November 2010. Patient did have postsurgical changes at L5-S1 but still some mild L5 and S1 nerve root impingement noted.  Patient initially was brought a new MRI with him today. Did show that patient If lower, then increase the intensity of your exercise. If the number is higher, you may decrease the intensity of your exercise. At L5-S1 with broad-based bulge that could be causing potentially right S1 nerve root impingement.  Past Medical History:  Diagnosis Date  . Strep throat    Past Surgical History:  Procedure Laterality Date  . BACK SURGERY    . COLONOSCOPY  04/06/15  . ESOPHAGOGASTRODUODENOSCOPY  04/06/15  . TONSILLECTOMY     Social History   Social History  . Marital status: Married    Spouse name: N/A  . Number of children: N/A  . Years of education: N/A   Social History Main Topics  . Smoking status: Former Smoker    Quit date: 04/04/2003  . Smokeless tobacco: Current User    Types: Chew  . Alcohol use No  . Drug use: No  . Sexual activity: Not Asked   Other Topics Concern  . None    Social History Narrative  . None   Allergies  Allergen Reactions  . Penicillins Rash   Family History  Problem Relation Age of Onset  . Diabetes Maternal Grandmother   . Stroke Maternal Grandmother   . Dementia Maternal Grandmother   . Emphysema Paternal Grandfather   . Dementia Paternal Grandfather   . Heart disease Paternal Grandfather   . ALS Maternal Grandfather   . Dementia Paternal Grandmother   . Cancer Neg Hx   . COPD Neg Hx   . Hypertension Neg Hx     Past medical history, social, surgical and family history all reviewed in electronic medical record.  No pertanent information unless stated regarding to the chief complaint.   Review of Systems: No headache, visual changes, nausea, vomiting, diarrhea, constipation, dizziness, abdominal pain, skin rash, fevers, chills, night sweats, weight loss, swollen lymph nodes, body aches, joint swelling,  chest pain, shortness of breath, mood changes.  Positive muscle aches   Objective  Blood pressure 110/78, pulse 64, height 6\' 1"  (1.854 m), weight 187 lb (84.8 kg).   Systems examined below as of 10/03/16 General: NAD A&O x3 mood, affect normal  HEENT: Pupils equal, extraocular movements intact no nystagmus Respiratory: not short of breath at rest or with speaking Cardiovascular: No lower extremity edema, non tender Skin: Warm dry intact with no signs of infection or rash on extremities or  on axial skeleton. Abdomen: Soft nontender, no masses Neuro: Cranial nerves  intact, neurovascularly intact in all extremities with 2+ DTRs and 2+ pulses. Lymph: No lymphadenopathy appreciated today  Gait normal with good balance and coordination.  MSK: Non tender with full range of motion and good stability and symmetric strength and tone of shoulders, elbows, wrist,  knee hips and ankles bilaterally.   Back Exam:  Inspection: Unremarkable  Motion: Flexion 35 deg, Extension 25 deg, Side Bending to 35 deg bilaterally,  Rotation to 25  deg bilaterally  SLR laying: mild positive right XSLR laying: Negative  Palpable tenderness: significant tightness bilaterally. FABER: negative. Sensory change: Gross sensation intact to all lumbar and sacral dermatomes.  Reflexes: 2+ at both patellar tendons, 2+ at achilles tendons, Babinski's downgoing.  Strength at foot  Plantar-flexion: 5/5 Dorsi-flexion: 5/5 Eversion: 5/5 Inversion: 5/5  Leg strength  Quad: 5/5 Hamstring: 5/5 Hip flexor: 5/5 Hip abductors: 5/5  Gait unremarkable.   Osteopathic findings C2 flexed rotated and side bent right C4 flexed rotated and side bent left T3 extended rotated and side bent right inhaled third rib T9 extended rotated and side bent left L2 flexed rotated and side bent right l5 flexed rotated and side bent left Sacrum right on right     Impression and Recommendations:     This case required medical decision making of moderate complexity.      Note: This dictation was prepared with Dragon dictation along with smaller phrase technology. Any transcriptional errors that result from this process are unintentional.

## 2016-10-03 ENCOUNTER — Ambulatory Visit (INDEPENDENT_AMBULATORY_CARE_PROVIDER_SITE_OTHER): Payer: BLUE CROSS/BLUE SHIELD | Admitting: Family Medicine

## 2016-10-03 ENCOUNTER — Encounter: Payer: Self-pay | Admitting: Family Medicine

## 2016-10-03 VITALS — BP 110/78 | HR 64 | Ht 73.0 in | Wt 187.0 lb

## 2016-10-03 DIAGNOSIS — M999 Biomechanical lesion, unspecified: Secondary | ICD-10-CM

## 2016-10-03 DIAGNOSIS — M5416 Radiculopathy, lumbar region: Secondary | ICD-10-CM | POA: Diagnosis not present

## 2016-10-03 NOTE — Assessment & Plan Note (Signed)
Continues to have some lumbar radiculopathy. Discussed with patient at great length. We discussed icing regimen and home exercises.  We discussed which activities to do in which ones to avoid. Patient will start to increase activity as tolerated. Follow-up with me again after the epidurals.

## 2016-10-03 NOTE — Patient Instructions (Addendum)
Good to see you  Travis Dorsey is your friend.  They will call you on the injection  Zanaflex at night if needed for muscle spasm.  After you have the injection see me again in 3-4 weeks.

## 2016-10-03 NOTE — Assessment & Plan Note (Signed)
Decision today to treat with OMT was based on Physical Exam  After verbal consent patient was treated with HVLA, ME, FPR techniques in cervical, thoracic, lumbar and sacral areas  Patient tolerated the procedure well with improvement in symptoms  Patient given exercises, stretches and lifestyle modifications  See medications in patient instructions if given  Patient will follow up in 4-6 weeks 

## 2016-10-12 ENCOUNTER — Ambulatory Visit
Admission: EM | Admit: 2016-10-12 | Discharge: 2016-10-12 | Disposition: A | Discharge status: Home / Self Care | Payer: BLUE CROSS/BLUE SHIELD | Attending: Family Medicine | Admitting: Family Medicine

## 2016-10-12 ENCOUNTER — Encounter: Payer: Self-pay | Admitting: *Deleted

## 2016-10-12 DIAGNOSIS — Z024 Encounter for examination for driving license: Secondary | ICD-10-CM

## 2016-10-12 DIAGNOSIS — Z0289 Encounter for other administrative examinations: Secondary | ICD-10-CM

## 2016-10-12 LAB — DEPT OF TRANSP DIPSTICK, URINE (ARMC ONLY)
Glucose, UA: NEGATIVE mg/dL
Hgb urine dipstick: NEGATIVE
Protein, ur: NEGATIVE mg/dL
Specific Gravity, Urine: 1.015 (ref 1.005–1.030)

## 2016-10-12 NOTE — Discharge Instructions (Signed)
Great job losing weight and getting from diabetic to Prediabetic level!

## 2016-10-12 NOTE — ED Triage Notes (Signed)
DOT physical. 

## 2016-10-12 NOTE — ED Provider Notes (Signed)
[facility]    CSN: [id number] Arrival date & time: 10/12/16  0911     History   Chief Complaint Chief Complaint  Patient presents with  . Commercial Driver's License Exam    HPI Travis Dorsey is a 46 y.o. male.   Patient here for DOT Physical (see scanned form)   The history is provided by the patient.    Past Medical History:  Diagnosis Date  . Strep throat     Patient Active Problem List   Diagnosis Date Noted  . Elevated low density lipoprotein (LDL) cholesterol level 10/01/2016  . Nonallopathic lesion of lumbosacral region 08/30/2016  . Nonallopathic lesion of sacral region 08/30/2016  . Nonallopathic lesion of thoracic region 08/30/2016  . Hypertriglyceridemia 06/28/2016  . Elevated blood uric acid level 06/24/2016  . Gout 06/18/2016  . Diabetes (HCC) 06/18/2016  . Lumbar radiculopathy 06/17/2016  . DOE (dyspnea on exertion) 05/12/2015  . Multiple pulmonary nodules 05/12/2015  . Snoring 04/13/2015  . Sliding hiatal hernia 03/05/2015  . SOB (shortness of breath) 03/05/2015  . 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 03/05/2015  . Obesity 03/05/2015  . Elevated serum creatinine 03/05/2015  . Esophageal reflux 02/21/2015  . Abdominal cramps 02/21/2015  . Elevated blood pressure 02/21/2015  . Abnormal stools 02/16/2015    Past Surgical History:  Procedure Laterality Date  . BACK SURGERY    . COLONOSCOPY  04/06/15  . ESOPHAGOGASTRODUODENOSCOPY  04/06/15  . TONSILLECTOMY         Home Medications    Prior to Admission medications   Medication Sig Start Date End Date Taking? Authorizing Provider  Omega-3 Fatty Acids (FISH OIL) 1200 MG CAPS Take by mouth 2 (two) times daily.   Yes [provider]  Turmeric 500 MG TABS Take by mouth as needed.    Yes [provider]  Vitamin D, Ergocalciferol, (DRISDOL) 50000 units CAPS capsule Take 50,000 Units by mouth once a week.  08/27/16  Yes [provider]   montelukast (SINGULAIR) 10 MG tablet Take 1 tablet (10 mg total) by mouth at bedtime. 08/30/16   Judi SaaSmith, Zachary M, DO    Family History Family History  Problem Relation Age of Onset  . Diabetes Maternal Grandmother   . Stroke Maternal Grandmother   . Dementia Maternal Grandmother   . Emphysema Paternal Grandfather   . Dementia Paternal Grandfather   . Heart disease Paternal Grandfather   . ALS Maternal Grandfather   . Dementia Paternal Grandmother   . Cancer Neg Hx   . COPD Neg Hx   . Hypertension Neg Hx     Social History Social History  Substance Use Topics  . Smoking status: Former Smoker    Quit date: 04/04/2003  . Smokeless tobacco: Current User    Types: Chew  . Alcohol use No     Allergies   Penicillins   Review of Systems Review of Systems   Physical Exam Triage Vital Signs ED Triage Vitals  Enc Vitals Group     BP 10/12/16 0938 124/86     Pulse Rate 10/12/16 0938 75     Resp 10/12/16 0938 16     Temp 10/12/16 0938 98.1 F (36.7 C)     Temp Source 10/12/16 0938 Oral     SpO2 10/12/16 0938 100 %     Weight 10/12/16 0940 187 lb (84.8 kg)     Height 10/12/16 0940 6\' 1"  (1.854 m)     Head Circumference --  Peak Flow --      Pain Score --      Pain Loc --      Pain Edu? --      Excl. in GC? --    No data found.   Updated Vital Signs BP 124/86 (BP Location: Left Arm)   Pulse 75   Temp 98.1 F (36.7 C) (Oral)   Resp 16   Ht 6\' 1"  (1.854 m)   Wt 187 lb (84.8 kg)   SpO2 100%   BMI 24.67 kg/m   Visual Acuity Right Eye Distance:   Left Eye Distance:   Bilateral Distance:    Right Eye Near:   Left Eye Near:    Bilateral Near:     Physical Exam   UC Treatments / Results  Labs (all labs ordered are listed, but only abnormal results are displayed) Labs Reviewed  DEPT OF TRANSP DIPSTICK, URINE([facility] ONLY)    EKG  EKG Interpretation None       Radiology No results found.  Procedures Procedures (including  critical care time)  Medications Ordered in UC Medications - No data to display   Initial Impression / Assessment and Plan / UC Course  I have reviewed the triage vital signs and the nursing notes.  Pertinent labs & imaging results that were available during my care of the patient were reviewed by me and considered in my medical decision making (see chart for details).       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

## 2016-10-14 ENCOUNTER — Ambulatory Visit
Admission: RE | Admit: 2016-10-14 | Discharge: 2016-10-14 | Disposition: A | Discharge status: Home / Self Care | Payer: BLUE CROSS/BLUE SHIELD | Source: Ambulatory Visit | Attending: Family Medicine | Admitting: Family Medicine

## 2016-10-14 DIAGNOSIS — M5416 Radiculopathy, lumbar region: Secondary | ICD-10-CM

## 2016-10-14 DIAGNOSIS — M5137 Other intervertebral disc degeneration, lumbosacral region: Secondary | ICD-10-CM | POA: Diagnosis not present

## 2016-10-14 MED ORDER — METHYLPREDNISOLONE ACETATE 40 MG/ML INJ SUSP (RADIOLOG
120.0000 mg | Freq: Once | INTRAMUSCULAR | Status: AC
  Administered 2016-10-14: 120 mg via EPIDURAL

## 2016-10-14 MED ORDER — IOPAMIDOL (ISOVUE-M 200) INJECTION 41%
1.0000 mL | Freq: Once | INTRAMUSCULAR | Status: AC
  Administered 2016-10-14: 1 mL via EPIDURAL

## 2016-10-14 NOTE — Discharge Instructions (Signed)

## 2016-10-30 ENCOUNTER — Other Ambulatory Visit: Payer: Self-pay | Admitting: Family Medicine

## 2016-10-30 NOTE — Telephone Encounter (Signed)
Refill done.  

## 2016-11-17 ENCOUNTER — Other Ambulatory Visit: Payer: Self-pay | Admitting: Family Medicine

## 2017-01-31 ENCOUNTER — Ambulatory Visit: Payer: BLUE CROSS/BLUE SHIELD | Admitting: Family Medicine

## 2017-04-26 DIAGNOSIS — R079 Chest pain, unspecified: Secondary | ICD-10-CM | POA: Diagnosis not present

## 2017-04-26 DIAGNOSIS — R140 Abdominal distension (gaseous): Secondary | ICD-10-CM | POA: Diagnosis not present

## 2017-04-26 DIAGNOSIS — M549 Dorsalgia, unspecified: Secondary | ICD-10-CM | POA: Diagnosis not present

## 2017-04-26 DIAGNOSIS — R5383 Other fatigue: Secondary | ICD-10-CM | POA: Diagnosis not present

## 2017-04-26 DIAGNOSIS — K529 Noninfective gastroenteritis and colitis, unspecified: Secondary | ICD-10-CM | POA: Diagnosis not present

## 2017-04-26 DIAGNOSIS — R9431 Abnormal electrocardiogram [ECG] [EKG]: Secondary | ICD-10-CM | POA: Diagnosis not present

## 2017-04-26 DIAGNOSIS — R1084 Generalized abdominal pain: Secondary | ICD-10-CM | POA: Diagnosis not present

## 2017-04-26 DIAGNOSIS — R000 Tachycardia, unspecified: Secondary | ICD-10-CM | POA: Diagnosis not present

## 2017-04-26 DIAGNOSIS — R0789 Other chest pain: Secondary | ICD-10-CM | POA: Diagnosis not present

## 2017-04-26 DIAGNOSIS — R109 Unspecified abdominal pain: Secondary | ICD-10-CM | POA: Diagnosis not present

## 2024-03-23 ENCOUNTER — Encounter: Payer: Self-pay | Admitting: Dermatology

## 2024-03-23 ENCOUNTER — Ambulatory Visit: Admitting: Dermatology

## 2024-03-23 DIAGNOSIS — L720 Epidermal cyst: Secondary | ICD-10-CM | POA: Diagnosis not present

## 2024-03-23 DIAGNOSIS — D225 Melanocytic nevi of trunk: Secondary | ICD-10-CM | POA: Diagnosis not present

## 2024-03-23 DIAGNOSIS — D485 Neoplasm of uncertain behavior of skin: Secondary | ICD-10-CM | POA: Diagnosis not present

## 2024-03-23 DIAGNOSIS — D239 Other benign neoplasm of skin, unspecified: Secondary | ICD-10-CM

## 2024-03-23 DIAGNOSIS — L723 Sebaceous cyst: Secondary | ICD-10-CM

## 2024-03-23 HISTORY — DX: Other benign neoplasm of skin, unspecified: D23.9

## 2024-03-23 NOTE — Patient Instructions (Addendum)
 Biopsy Wound Care Instructions  Leave the original bandage on for 24 hours if possible.  If the bandage becomes soaked or soiled before that time, it is OK to remove it and examine the wound.  A small amount of post-operative bleeding is normal.  If excessive bleeding occurs, remove the bandage, place gauze over the site and apply continuous pressure (no peeking) over the area for 30 minutes. If this does not work, please call our clinic as soon as possible or page your doctor if it is after hours.   Once a day, cleanse the wound with soap and water. It is fine to shower. If a thick crust develops you may use a Q-tip dipped into dilute hydrogen peroxide (mix 1:1 with water) to dissolve it.  Hydrogen peroxide can slow the healing process, so use it only as needed.    After washing, apply petroleum jelly (Vaseline) or an antibiotic ointment if your doctor prescribed one for you, followed by a bandage.    For best healing, the wound should be covered with a layer of ointment at all times. If you are not able to keep the area covered with a bandage to hold the ointment in place, this may mean re-applying the ointment several times a day.  Continue this wound care until the wound has healed and is no longer open.   Itching and mild discomfort is normal during the healing process. However, if you develop pain or severe itching, please call our office.   If you have any discomfort, you can take Tylenol  (acetaminophen ) or ibuprofen as directed on the bottle. (Please do not take these if you have an allergy to them or cannot take them for another reason).  Some redness, tenderness and white or yellow material in the wound is normal healing.  If the area becomes very sore and red, or develops a thick yellow-green material (pus), it may be infected; please notify us .    If you have stitches, return to clinic as directed to have the stitches removed. You will continue wound care for 2-3 days after the stitches  are removed.   Wound healing continues for up to one year following surgery. It is not unusual to experience pain in the scar from time to time during the interval.  If the pain becomes severe or the scar thickens, you should notify the office.    A slight amount of redness in a scar is expected for the first six months.  After six months, the redness will fade and the scar will soften and fade.  The color difference becomes less noticeable with time.  If there are any problems, return for a post-op surgery check at your earliest convenience.  To improve the appearance of the scar, you can use silicone scar gel, cream, or sheets (such as Mederma or Serica) every night for up to one year. These are available over the counter (without a prescription).  Please call our office at [phone] for any questions or concerns.      Due to recent changes in healthcare laws, you may see results of your pathology and/or laboratory studies on MyChart before the doctors have had a chance to review them. We understand that in some cases there may be results that are confusing or concerning to you. Please understand that not all results are received at the same time and often the doctors may need to interpret multiple results in order to provide you with the best plan of care or  course of treatment. Therefore, we ask that you please give us  2 business days to thoroughly review all your results before contacting the office for clarification. Should we see a critical lab result, you will be contacted sooner.   If You Need Anything After Your Visit  If you have any questions or concerns for your doctor, please call our main line at [phone] and press option 4 to reach your doctor's medical assistant. If no one answers, please leave a voicemail as directed and we will return your call as soon as possible. Messages left after 4 pm will be answered the following business day.   You may also send us  a message  via MyChart. We typically respond to MyChart messages within 1-2 business days.  For prescription refills, please ask your pharmacy to contact our office. Our fax number is [phone].  If you have an urgent issue when the clinic is closed that cannot wait until the next business day, you can page your doctor at the number below.    Please note that while we do our best to be available for urgent issues outside of office hours, we are not available 24/7.   If you have an urgent issue and are unable to reach us , you may choose to seek medical care at your doctor's office, retail clinic, urgent care center, or emergency room.  If you have a medical emergency, please immediately call 911 or go to the emergency department.  Pager Numbers  - Dr. Hester: [phone]  - Dr. Jackquline: [phone]  - Dr. Claudene: [phone]   - Dr. Raymund: [phone]  In the event of inclement weather, please call our main line at [phone] for an update on the status of any delays or closures.  Dermatology Medication Tips: Please keep the boxes that topical medications come in in order to help keep track of the instructions about where and how to use these. Pharmacies typically print the medication instructions only on the boxes and not directly on the medication tubes.   If your medication is too expensive, please contact our office at [phone] option 4 or send us  a message through MyChart.   We are unable to tell what your co-pay for medications will be in advance as this is different depending on your insurance coverage. However, we may be able to find a substitute medication at lower cost or fill out paperwork to get insurance to cover a needed medication.   If a prior authorization is required to get your medication covered by your insurance company, please allow us  1-2 business days to complete this process.  Drug prices often vary depending on where the prescription is filled and some  pharmacies may offer cheaper prices.  The website www.goodrx.com contains coupons for medications through different pharmacies. The prices here do not account for what the cost may be with help from insurance (it may be cheaper with your insurance), but the website can give you the price if you did not use any insurance.  - You can print the associated coupon and take it with your prescription to the pharmacy.  - You may also stop by our office during regular business hours and pick up a GoodRx coupon card.  - If you need your prescription sent electronically to a different pharmacy, notify our office through [facility] or by phone at [phone] option 4.     Si Usted Necesita Algo Despus de Su Visita  Tambin puede enviarnos un mensaje a travs de  MyChart. Por lo general respondemos a los mensajes de MyChart en el transcurso de 1 a 2 das hbiles.  Para renovar recetas, por favor pida a su farmacia que se ponga en contacto con nuestra oficina. Randi lakes de fax es [place] [phone].  Si tiene un asunto urgente cuando la clnica est cerrada y que no puede esperar hasta el siguiente da hbil, puede llamar/localizar a su doctor(a) al nmero que aparece a continuacin.   Por favor, tenga en cuenta que aunque hacemos todo lo posible para estar disponibles para asuntos urgentes fuera del horario de [place], no estamos disponibles las 24 horas del da, los 7 [address]  [address] de la [place].   Si tiene un problema urgente y no puede comunicarse con nosotros, puede optar por buscar atencin mdica  en el consultorio de su doctor(a), en una clnica privada, en un centro de atencin urgente o en una sala de emergencias.  Si tiene Engineer, drilling, por favor llame inmediatamente al 911 o vaya a la sala de emergencias.  Nmeros de bper  - Dr. Hester: [phone]  - Dra. Jackquline: [phone]  - Dr. Claudene: [phone]  - Dra. Kitts: [phone]  En caso de inclemencias del [place],  por favor llame a nuestra lnea principal al [phone] para una actualizacin sobre el estado de cualquier retraso o cierre.  Consejos para la medicacin en dermatologa: Por favor, guarde las cajas en las que vienen los medicamentos de uso tpico para ayudarle a seguir las instrucciones sobre dnde y cmo usarlos. Las farmacias generalmente imprimen las instrucciones del medicamento slo en las cajas y no directamente en los tubos del [place].   Si su medicamento es muy caro, por favor, pngase en contacto con landry rieger llamando al [phone] y presione la opcin 4 o envenos un mensaje a travs de Clinical cytogeneticist.   No podemos decirle cul ser su copago por los medicamentos por adelantado ya que esto es diferente dependiendo de la cobertura de su seguro. Sin embargo, es posible que podamos encontrar un medicamento sustituto a Audiological scientist un formulario para que el seguro cubra el medicamento que se considera necesario.   Si se requiere una autorizacin previa para que su compaa de seguros malta su medicamento, por favor permtanos de 1 a 2 das hbiles para completar este proceso.  Los precios de los medicamentos varan con frecuencia dependiendo del Environmental consultant de dnde se surte la receta y alguna farmacias pueden ofrecer precios ms baratos.  El sitio web www.goodrx.com tiene cupones para medicamentos de Health and safety inspector. Los precios aqu no tienen en cuenta lo que podra costar con la ayuda del seguro (puede ser ms barato con su seguro), pero el sitio web puede darle el precio si no utiliz Tourist information centre manager.  - Puede imprimir el cupn correspondiente y llevarlo con su receta a la farmacia.  - Tambin puede pasar por nuestra oficina durante el horario de atencin regular y Education officer, museum una tarjeta de cupones de GoodRx.  - Si necesita que su receta se enve electrnicamente a una farmacia diferente, informe a nuestra oficina a travs de MyChart de [place] o por telfono llamando al  [phone] y presione la opcin 4.

## 2024-03-23 NOTE — Progress Notes (Signed)
" ° °  New Patient Visit   Subjective  Travis Dorsey is a 53 y.o. male who presents for the following: spot at back. First came up like a pimple during summer in 2000. Patient has drained it in the past. It is painful, red and draining today.  The following portions of the chart were reviewed this encounter and updated as appropriate: medications, allergies, medical history  Review of Systems:  No other skin or systemic complaints except as noted in HPI or Assessment and Plan.  Objective  Well appearing patient in no apparent distress; mood and affect are within normal limits.    A focused examination was performed of the following areas: back  Relevant exam findings are noted in the Assessment and Plan.  Right Upper Back 8 mm irregular dark brown multicolored macule   Assessment & Plan     INFLAMED EPIDERMOID CYST OF SKIN Right Upper Back - Incision and Drainage - Right Upper Back Location: right upper back  Informed Consent: Discussed risks (permanent scarring, light or dark discoloration, infection, pain, bleeding, bruising, redness, damage to adjacent structures, and recurrence of the lesion) and benefits of the procedure, as well as the alternatives.  Informed consent was obtained.  Preparation: The area was prepped with alcohol.  Anesthesia: Lidocaine 1% with epinephrine.   Procedure Details: An incision was made overlying the lesion. The lesion drained pus and blood.  A small amount of fluid was drained.    Antibiotic ointment and a sterile pressure dressing were applied. The patient tolerated procedure well.  Total number of lesions drained: 1  Plan: The patient was instructed on post-op care. Apply Vaseline and bandage daily until healed. Recommend OTC analgesia as needed for pain.   NEOPLASM OF UNCERTAIN BEHAVIOR OF SKIN Right Upper Back - Epidermal / dermal shaving  Lesion diameter (cm):  0.8 Informed consent: discussed and consent obtained   Timeout:  patient name, date of birth, surgical site, and procedure verified   Procedure prep:  Patient was prepped and draped in usual sterile fashion Prep type:  Isopropyl alcohol Anesthesia: the lesion was anesthetized in a standard fashion   Anesthetic:  1% lidocaine w/ epinephrine 1-100,000 buffered w/ 8.4% NaHCO3 Instrument used: DermaBlade   Hemostasis achieved with: pressure and aluminum chloride   Outcome: patient tolerated procedure well   Post-procedure details: wound care instructions given    Specimen 1 - Surgical pathology Differential Diagnosis: Lentigo vs Lentigo Maligna  Check Margins: No  Return if symptoms worsen or fail to improve.  Travis Dorsey, RMA, am acting as scribe for Boneta Sharps, MD .   Documentation: I have reviewed the above documentation for accuracy and completeness, and I agree with the above.  Boneta Sharps, MD    "

## 2024-03-30 ENCOUNTER — Ambulatory Visit: Payer: Self-pay | Admitting: Dermatology

## 2024-03-30 LAB — SURGICAL PATHOLOGY

## 2024-04-05 ENCOUNTER — Encounter: Payer: Self-pay | Admitting: Dermatology

## 2024-04-05 ENCOUNTER — Telehealth: Payer: Self-pay

## 2024-04-05 NOTE — Telephone Encounter (Signed)
 Left message for patient to return call regarding results

## 2024-04-05 NOTE — Telephone Encounter (Signed)
-----   Message from Boneta Sharps, MD sent at 03/30/2024  5:30 PM EST ----- Diagnosis: right upper back :       DYSPLASTIC COMPOUND NEVUS WITH SEVERE ATYPIA, DEEP MARGIN INVOLVED, SEE       DESCRIPTION    Please call to share diagnosis and offer excision.   Explanation: Your biopsy shows an atypical mole. It looks unusual enough that it cannot be distinguished from an early melanoma skin cancer. The safest course of action is to ensure it is fully  removed with a surgery.  Treatment: you return for an hour long appointment where we perform a skin surgery. We numb the site of the skin cancer and a safety margin of normal skin around it. We remove the full thickness of  skin and close the wound with two layers of stitches. The sample is sent to the lab to check that the skin cancer was fully removed. Return one week later to have wound checked and surface stitches  removed. Surgical wound leaves a line scar. Risks include pain, infection, bleeding, thickened scar, wound dehiscence, recurrence

## 2024-04-05 NOTE — Telephone Encounter (Signed)
 Patient returned call regarding results. States he is unable to make appointment right now for surgery to remove dysplastic nevus at right upper back. He is in between jobs and solicitor. He will call us  back in a few months to schedule.     ----- Message from Boneta Sharps, MD sent at 03/30/2024  5:30 PM EST ----- Diagnosis: right upper back :       DYSPLASTIC COMPOUND NEVUS WITH SEVERE ATYPIA, DEEP MARGIN INVOLVED, SEE       DESCRIPTION    Please call to share diagnosis and offer excision.   Explanation: Your biopsy shows an atypical mole. It looks unusual enough that it cannot be distinguished from an early melanoma skin cancer. The safest course of action is to ensure it is fully  removed with a surgery.  Treatment: you return for an hour long appointment where we perform a skin surgery. We numb the site of the skin cancer and a safety margin of normal skin around it. We remove the full thickness of  skin and close the wound with two layers of stitches. The sample is sent to the lab to check that the skin cancer was fully removed. Return one week later to have wound checked and surface stitches  removed. Surgical wound leaves a line scar. Risks include pain, infection, bleeding, thickened scar, wound dehiscence, recurrence
